# Patient Record
Sex: Female | Born: 1950
Health system: Southern US, Community
[De-identification: ages and names within clinical notes are randomized; demographics above are authoritative.]

## PROBLEM LIST (undated history)

## (undated) ENCOUNTER — Emergency Department (HOSPITAL_COMMUNITY): Discharge status: Absent without leave | Payer: Medicare HMO

## (undated) DIAGNOSIS — E785 Hyperlipidemia, unspecified: Secondary | ICD-10-CM

## (undated) DIAGNOSIS — M810 Age-related osteoporosis without current pathological fracture: Secondary | ICD-10-CM

## (undated) DIAGNOSIS — R112 Nausea with vomiting, unspecified: Secondary | ICD-10-CM

## (undated) DIAGNOSIS — M1990 Unspecified osteoarthritis, unspecified site: Secondary | ICD-10-CM

## (undated) DIAGNOSIS — I10 Essential (primary) hypertension: Secondary | ICD-10-CM

## (undated) DIAGNOSIS — E119 Type 2 diabetes mellitus without complications: Secondary | ICD-10-CM

## (undated) HISTORY — DX: Essential (primary) hypertension: I10

## (undated) HISTORY — DX: Type 2 diabetes mellitus without complications: E11.9

## (undated) HISTORY — DX: Nausea with vomiting, unspecified: R11.2

## (undated) HISTORY — DX: Age-related osteoporosis without current pathological fracture: M81.0

## (undated) HISTORY — DX: Hyperlipidemia, unspecified: E78.5

## (undated) HISTORY — DX: Unspecified osteoarthritis, unspecified site: M19.90

---

## 1997-02-13 HISTORY — PX: FOOT SURGERY: SHX648

## 2005-04-07 ENCOUNTER — Ambulatory Visit: Payer: Self-pay | Admitting: Unknown Physician Specialty

## 2009-05-20 ENCOUNTER — Ambulatory Visit: Payer: Self-pay | Admitting: Internal Medicine

## 2011-03-16 ENCOUNTER — Ambulatory Visit: Payer: Self-pay | Admitting: Internal Medicine

## 2012-09-12 DIAGNOSIS — M5116 Intervertebral disc disorders with radiculopathy, lumbar region: Secondary | ICD-10-CM | POA: Insufficient documentation

## 2012-10-03 ENCOUNTER — Ambulatory Visit: Payer: Self-pay | Admitting: Family Medicine

## 2012-11-26 ENCOUNTER — Ambulatory Visit: Payer: Self-pay | Admitting: Family Medicine

## 2012-12-30 ENCOUNTER — Ambulatory Visit: Payer: Self-pay | Admitting: Gastroenterology

## 2014-08-20 ENCOUNTER — Encounter: Payer: Self-pay | Admitting: Family Medicine

## 2014-08-20 ENCOUNTER — Ambulatory Visit (INDEPENDENT_AMBULATORY_CARE_PROVIDER_SITE_OTHER): Payer: Commercial Managed Care - HMO | Admitting: Family Medicine

## 2014-08-20 VITALS — BP 122/80 | HR 74 | Wt 142.0 lb

## 2014-08-20 DIAGNOSIS — I10 Essential (primary) hypertension: Secondary | ICD-10-CM

## 2014-08-20 DIAGNOSIS — E119 Type 2 diabetes mellitus without complications: Secondary | ICD-10-CM

## 2014-08-20 DIAGNOSIS — E559 Vitamin D deficiency, unspecified: Secondary | ICD-10-CM | POA: Diagnosis not present

## 2014-08-20 MED ORDER — METFORMIN HCL 1000 MG PO TABS: 1000.0000 mg | ORAL_TABLET | Freq: Two times a day (BID) | ORAL | Status: DC

## 2014-08-20 MED ORDER — GLIPIZIDE 5 MG PO TABS: 5.0000 mg | ORAL_TABLET | Freq: Every day | ORAL | Status: DC

## 2014-08-20 MED ORDER — LOSARTAN POTASSIUM 100 MG PO TABS: 100.0000 mg | ORAL_TABLET | Freq: Every day | ORAL | Status: DC

## 2014-08-20 MED ORDER — HYDROCHLOROTHIAZIDE 12.5 MG PO TABS: 12.5000 mg | ORAL_TABLET | Freq: Every day | ORAL | Status: DC

## 2014-08-20 NOTE — Progress Notes (Signed)
Name: Brooke Randall   MRN: [id number]    DOB: March 20, 1950   Date:08/20/2014       Progress Note  Subjective  Chief Complaint  Chief Complaint  Patient presents with  . Diabetes    f/u     Diabetes She presents for her follow-up diabetic visit. She has type 2 diabetes mellitus. There are no hypoglycemic associated symptoms. Pertinent negatives for diabetes include no fatigue, no polydipsia, no polyuria and no weakness. Current diabetic treatment includes oral agent (dual therapy). She is following a diabetic and generally healthy diet. She participates in exercise three times a week. Her breakfast blood glucose is taken between 7-8 am. Her breakfast blood glucose range is generally 110-130 mg/dl. An ACE inhibitor/angiotensin II receptor blocker is being taken.      Past Medical History  Diagnosis Date  . Diabetes mellitus without complication   . Hypertension   . Arthritis   . Osteoporosis     Past Surgical History  Procedure Laterality Date  . Foot surgery Right 1999    Family History  Problem Relation Age of Onset  . Hypertension Mother   . Hypertension Father   . Diabetes Father   . Hypertension Sister   . Diabetes Sister   . Hypertension Brother     History   Social History  . Marital Status: Divorced    Spouse Name: N/A  . Number of Children: N/A  . Years of Education: N/A   Occupational History  . Unemployed     Looking for work   Social History Main Topics  . Smoking status: Never Smoker   . Smokeless tobacco: Never Used  . Alcohol Use: No  . Drug Use: No  . Sexual Activity: Not on file   Other Topics Concern  . Not on file   Social History Narrative     Current outpatient prescriptions:  .  alendronate (FOSAMAX) 35 MG tablet, Take 35 mg by mouth every 7 (seven) days. Take with a full glass of water on an empty stomach., Disp: , Rfl:  .  atorvastatin (LIPITOR) 40 MG tablet, Take 40 mg by mouth daily., Disp: , Rfl:  .  Calcium Carbonate-Vit D-Min  (CALCIUM/VITAMIN D/MINERALS) 600-200 MG-UNIT TABS, Take 1 tablet by mouth daily., Disp: , Rfl:  .  ergocalciferol (VITAMIN D2) 50000 UNITS capsule, Take 50,000 Units by mouth once a week., Disp: , Rfl:  .  gabapentin (NEURONTIN) 300 MG capsule, Take 300 mg by mouth 3 (three) times daily., Disp: , Rfl:  .  glipiZIDE (GLUCOTROL) 5 MG tablet, Take 5 mg by mouth daily before breakfast., Disp: , Rfl:  .  hydrochlorothiazide (HYDRODIURIL) 12.5 MG tablet, Take 12.5 mg by mouth daily., Disp: , Rfl:  .  losartan (COZAAR) 100 MG tablet, Take 100 mg by mouth daily., Disp: , Rfl:  .  meloxicam (MOBIC) 15 MG tablet, Take 15 mg by mouth daily., Disp: , Rfl:  .  metFORMIN (GLUCOPHAGE) 1000 MG tablet, Take 1,000 mg by mouth 2 (two) times daily with a meal., Disp: , Rfl:   No Known Allergies   Review of Systems  Constitutional: Negative for fatigue.  Genitourinary: Negative for frequency.  Neurological: Negative for weakness.  Endo/Heme/Allergies: Negative for polydipsia.      Objective  Filed Vitals:   08/20/14 1416  BP: 122/80  Pulse: 74  Weight: 142 lb (64.411 kg)  SpO2: 96%    Physical Exam  Constitutional: She is well-developed, well-nourished, and in no distress.  HENT:  Head: Normocephalic and atraumatic.  Cardiovascular: Normal rate and regular rhythm.   Pulmonary/Chest: Effort normal and breath sounds normal.  Nursing note and vitals reviewed.   Assessment & Plan  1. Diabetes mellitus without complication  - HgB K7Q - metFORMIN (GLUCOPHAGE) 1000 MG tablet; Take 1 tablet (1,000 mg total) by mouth 2 (two) times daily with a meal.  Dispense: 180 tablet; Refill: 3 - glipiZIDE (GLUCOTROL) 5 MG tablet; Take 1 tablet (5 mg total) by mouth daily before breakfast.  Dispense: 90 tablet; Refill: 3  2. Essential hypertension  - losartan (COZAAR) 100 MG tablet; Take 1 tablet (100 mg total) by mouth daily.  Dispense: 90 tablet; Refill: 3 - hydrochlorothiazide (HYDRODIURIL) 12.5 MG  tablet; Take 1 tablet (12.5 mg total) by mouth daily.  Dispense: 90 tablet; Refill: 3  3. Vitamin D deficiency  - Vit D  25 hydroxy (rtn osteoporosis monitoring)  4. Hyperlipidemia Patient has stopped taking Lipitor because of muscle aches. Repeat FLP in 3 months. Advised on dietary and lifestyle changes.  Brooke Randall A. [facility] 08/20/2014 2:36 PM

## 2014-08-21 LAB — HEMOGLOBIN A1C
ESTIMATED AVERAGE GLUCOSE: 163 mg/dL
Hgb A1c MFr Bld: 7.3 % — ABNORMAL HIGH (ref 4.8–5.6)

## 2014-08-21 LAB — VITAMIN D 25 HYDROXY (VIT D DEFICIENCY, FRACTURES): Vit D, 25-Hydroxy: 27.5 ng/mL — ABNORMAL LOW (ref 30.0–100.0)

## 2014-11-20 ENCOUNTER — Ambulatory Visit: Payer: Commercial Managed Care - HMO | Admitting: Family Medicine

## 2014-12-17 ENCOUNTER — Other Ambulatory Visit: Payer: Self-pay

## 2014-12-17 ENCOUNTER — Ambulatory Visit (INDEPENDENT_AMBULATORY_CARE_PROVIDER_SITE_OTHER): Payer: Commercial Managed Care - HMO | Admitting: Family Medicine

## 2014-12-17 ENCOUNTER — Encounter: Payer: Self-pay | Admitting: Family Medicine

## 2014-12-17 VITALS — BP 120/76 | HR 70 | Temp 98.5°F | Resp 18 | Ht 62.0 in | Wt 136.5 lb

## 2014-12-17 DIAGNOSIS — E785 Hyperlipidemia, unspecified: Secondary | ICD-10-CM | POA: Diagnosis not present

## 2014-12-17 DIAGNOSIS — E119 Type 2 diabetes mellitus without complications: Secondary | ICD-10-CM

## 2014-12-17 DIAGNOSIS — E1142 Type 2 diabetes mellitus with diabetic polyneuropathy: Secondary | ICD-10-CM | POA: Insufficient documentation

## 2014-12-17 DIAGNOSIS — I10 Essential (primary) hypertension: Secondary | ICD-10-CM | POA: Diagnosis not present

## 2014-12-17 LAB — GLUCOSE, POCT (MANUAL RESULT ENTRY): POC Glucose: 179 mg/dl — AB (ref 70–99)

## 2014-12-17 LAB — POCT GLYCOSYLATED HEMOGLOBIN (HGB A1C): Hemoglobin A1C: 7.1

## 2014-12-17 MED ORDER — ALENDRONATE SODIUM 35 MG PO TABS: 35.0000 mg | ORAL_TABLET | ORAL | Status: DC

## 2014-12-17 NOTE — Progress Notes (Signed)
Name: Brooke Randall   MRN: [id number]    DOB: Mar 18, 1950   Date:12/17/2014       Progress Note  Subjective  Chief Complaint  Chief Complaint  Patient presents with  . Follow-up    3 mo  . Labs Only    Fasting  . Diabetes  . Hypertension   Diabetes She presents for her follow-up diabetic visit. She has type 2 diabetes mellitus. Her disease course has been stable. There are no hypoglycemic associated symptoms. Pertinent negatives for hypoglycemia include no headaches. Pertinent negatives for diabetes include no blurred vision and no chest pain. Pertinent negatives for diabetic complications include no CVA. Current diabetic treatment includes oral agent (dual therapy). She is following a generally healthy diet. She has not had a previous visit with a dietitian. She participates in exercise every other day. Her breakfast blood glucose range is generally 110-130 mg/dl. An ACE inhibitor/angiotensin II receptor blocker is being taken.  Hypertension This is a chronic problem. The problem is unchanged. The problem is controlled. Pertinent negatives include no anxiety, blurred vision, chest pain, headaches, palpitations or shortness of breath. Past treatments include angiotensin blockers and diuretics. There is no history of kidney disease, CAD/MI or CVA.  Hyperlipidemia This is a chronic problem. The problem is uncontrolled. Pertinent negatives include no chest pain or shortness of breath. She is currently on no antihyperlipidemic treatment (Was on Atorvastatin, which was discontinued due to side effects.). Compliance problems include medication side effects.     Past Medical History  Diagnosis Date  . Diabetes mellitus without complication ([place])   . Hypertension   . Arthritis   . Osteoporosis     Past Surgical History  Procedure Laterality Date  . Foot surgery Right 1999    Family History  Problem Relation Age of Onset  . Hypertension Mother   . Hypertension Father   . Diabetes Father    . Hypertension Sister   . Diabetes Sister   . Hypertension Brother     Social History   Social History  . Marital Status: Divorced    Spouse Name: N/A  . Number of Children: N/A  . Years of Education: N/A   Occupational History  . Unemployed     Looking for work   Social History Main Topics  . Smoking status: Never Smoker   . Smokeless tobacco: Never Used  . Alcohol Use: No  . Drug Use: No  . Sexual Activity: Not on file   Other Topics Concern  . Not on file   Social History Narrative     Current outpatient prescriptions:  .  alendronate (FOSAMAX) 35 MG tablet, Take 35 mg by mouth every 7 (seven) days. Take with a full glass of water on an empty stomach., Disp: , Rfl:  .  B Complex Vitamins (VITAMIN B COMPLEX) TABS, , Disp: , Rfl:  .  Calcium Carbonate-Vit D-Min (CALCIUM/VITAMIN D/MINERALS) 600-200 MG-UNIT TABS, Take 1 tablet by mouth daily., Disp: , Rfl:  .  glipiZIDE (GLUCOTROL) 5 MG tablet, Take 1 tablet (5 mg total) by mouth daily before breakfast., Disp: 90 tablet, Rfl: 3 .  hydrochlorothiazide (HYDRODIURIL) 12.5 MG tablet, Take 1 tablet (12.5 mg total) by mouth daily., Disp: 90 tablet, Rfl: 3 .  losartan (COZAAR) 100 MG tablet, Take 1 tablet (100 mg total) by mouth daily., Disp: 90 tablet, Rfl: 3 .  metFORMIN (GLUCOPHAGE) 1000 MG tablet, Take 1 tablet (1,000 mg total) by mouth 2 (two) times daily with a meal., Disp: 180  tablet, Rfl: 3  No Known Allergies   Review of Systems  Eyes: Negative for blurred vision.  Respiratory: Negative for shortness of breath.   Cardiovascular: Negative for chest pain and palpitations.  Gastrointestinal: Negative for abdominal pain.  Genitourinary: Negative for dysuria.  Neurological: Negative for headaches.    Objective  Filed Vitals:   12/17/14 0823  BP: 120/76  Pulse: 70  Temp: 98.5 F (36.9 C)  TempSrc: Oral  Resp: 18  Height: 5\' 2"  (1.575 m)  Weight: 136 lb 8 oz (61.916 kg)  SpO2: 95%    Physical Exam   Constitutional: She is oriented to person, place, and time and well-developed, well-nourished, and in no distress.  HENT:  Head: Normocephalic and atraumatic.  Cardiovascular: Normal rate, regular rhythm and normal heart sounds.   No murmur heard. Pulmonary/Chest: Effort normal and breath sounds normal. She has no wheezes. She has no rales.  Abdominal: Soft. Bowel sounds are normal. There is no tenderness.  Musculoskeletal: She exhibits no edema.  Neurological: She is alert and oriented to person, place, and time.  Skin: Skin is warm and dry.  Nursing note and vitals reviewed.   Assessment & Plan  1. Type 2 diabetes mellitus without complication, without long-term current use of insulin (HCC) A1c of 7.1%, considered optimal control. Continue on present therapy. - POCT HgB A1C - POCT Glucose (CBG)  2. Essential hypertension BP at goal on present therapy.  3. Dyslipidemia In the past, patient has been on a atorvastatin, which had to be discontinued because of side effects. Recheck lipids and consider starting on a different statin. - Lipid Profile - Comprehensive Metabolic Panel (CMET)     Akhilesh Sassone Asad A. [facility] 12/17/2014 8:38 AM

## 2014-12-17 NOTE — Telephone Encounter (Signed)
Medication has been refilled and sent to [facility]  

## 2014-12-18 LAB — COMPREHENSIVE METABOLIC PANEL
A/G RATIO: 1.6 (ref 1.1–2.5)
ALT: 14 IU/L (ref 0–32)
AST: 14 IU/L (ref 0–40)
Albumin: 4.1 g/dL (ref 3.6–4.8)
Alkaline Phosphatase: 58 IU/L (ref 39–117)
BUN / CREAT RATIO: 15 (ref 11–26)
BUN: 12 mg/dL (ref 8–27)
Bilirubin Total: 0.7 mg/dL (ref 0.0–1.2)
CALCIUM: 9.6 mg/dL (ref 8.7–10.3)
CHLORIDE: 99 mmol/L (ref 97–106)
CO2: 21 mmol/L (ref 18–29)
Creatinine, Ser: 0.79 mg/dL (ref 0.57–1.00)
GFR calc Af Amer: 92 mL/min/{1.73_m2} (ref >59)
GFR, EST NON AFRICAN AMERICAN: 80 mL/min/{1.73_m2} (ref >59)
Globulin, Total: 2.5 g/dL (ref 1.5–4.5)
Glucose: 200 mg/dL — ABNORMAL HIGH (ref 65–99)
POTASSIUM: 4.2 mmol/L (ref 3.5–5.2)
Sodium: 137 mmol/L (ref 136–144)
TOTAL PROTEIN: 6.6 g/dL (ref 6.0–8.5)

## 2014-12-18 LAB — LIPID PANEL
CHOL/HDL RATIO: 4.9 ratio — AB (ref 0.0–4.4)
Cholesterol, Total: 196 mg/dL (ref 100–199)
HDL: 40 mg/dL (ref >39)
LDL Calculated: 122 mg/dL — ABNORMAL HIGH (ref 0–99)
Triglycerides: 172 mg/dL — ABNORMAL HIGH (ref 0–149)
VLDL Cholesterol Cal: 34 mg/dL (ref 5–40)

## 2014-12-23 ENCOUNTER — Telehealth: Payer: Self-pay | Admitting: Emergency Medicine

## 2014-12-23 MED ORDER — ROSUVASTATIN CALCIUM 5 MG PO TABS: 5.0000 mg | ORAL_TABLET | Freq: Every day | ORAL | Status: DC

## 2014-12-23 NOTE — Telephone Encounter (Signed)
Spoke to patient and notified of lab work. Script called into [facility] [address] for #30. If patient can tolerate she will come back for additional; refills.

## 2015-04-12 ENCOUNTER — Ambulatory Visit: Payer: Commercial Managed Care - HMO | Admitting: Family Medicine

## 2015-05-04 ENCOUNTER — Ambulatory Visit (INDEPENDENT_AMBULATORY_CARE_PROVIDER_SITE_OTHER): Payer: Medicare Other | Admitting: Family Medicine

## 2015-05-04 ENCOUNTER — Encounter: Payer: Self-pay | Admitting: Family Medicine

## 2015-05-04 VITALS — BP 120/73 | HR 81 | Temp 98.6°F | Resp 17 | Ht 62.0 in | Wt 139.2 lb

## 2015-05-04 DIAGNOSIS — Z0000 Encounter for general adult medical examination without abnormal findings: Secondary | ICD-10-CM

## 2015-05-04 DIAGNOSIS — K1370 Unspecified lesions of oral mucosa: Secondary | ICD-10-CM | POA: Insufficient documentation

## 2015-05-04 NOTE — Progress Notes (Signed)
Name: Brooke Randall   MRN: [id number]    DOB: Mar 31, 1950   Date:05/04/2015       Progress Note  Subjective  Chief Complaint  Chief Complaint  Patient presents with  . Annual Exam    CPE    HPI  Pt. Is here for an Annual Wellness visit. She is doing well.  Functional Ability and Safety Issues: No issues, no dependent on anyone for Activities of Daily Living. Lives by herself. No recent falls. No home safety issues. No difficulty with hearing/vision.  End-of-Life Planning: Does not have any advance directives. Would like to discuss them. No HCPOA.  Preventative Care:  As Follows, Colonoscopy: In 2014, was asked to return in 10 years. Pap Smear: In 2016 by [facility].  Mammogram: in 2016, by [facility] Dexa Scan: Last DEXA Scan in October 2014, was Osteopenic at left forearm.  Immunizations:  Flu Vaccine: November 2016 Tetanus: does not remember her last tetanus shot. Pneumovax: March 2013.  Zostavax: March 2013.  Depression and Fall Screening completed.  Discussed and is able to perform ADLs and IADLs without assistance.  Current Medical providers:  [facility] PCP: Dr. Rochel Brome Dr. Earnestine Leys: Orthopedics (last seen in 2016) Dr. Elvina Mattes: Podiatry (last seen in 2016).  Tongue Lesion: Onset 1 year ago, present intermittently. Resolves after 3-4 days but then comes back a while later. Always located in the same area. No changes in oral cavity otherwise.    Past Medical History  Diagnosis Date  . Diabetes mellitus without complication ([place])   . Hypertension   . Arthritis   . Osteoporosis     Past Surgical History  Procedure Laterality Date  . Foot surgery Right 1999    Family History  Problem Relation Age of Onset  . Hypertension Mother   . Hypertension Father   . Diabetes Father   . Hypertension Sister   . Diabetes Sister   . Hypertension Brother     Social History   Social History  . Marital Status: Divorced    Spouse Name: N/A   . Number of Children: N/A  . Years of Education: N/A   Occupational History  . Unemployed     Looking for work   Social History Main Topics  . Smoking status: Never Smoker   . Smokeless tobacco: Never Used  . Alcohol Use: No  . Drug Use: No  . Sexual Activity: Not on file   Other Topics Concern  . Not on file   Social History Narrative     Current outpatient prescriptions:  .  B Complex Vitamins (VITAMIN B COMPLEX) TABS, , Disp: , Rfl:  .  Calcium Carbonate-Vit D-Min (CALCIUM/VITAMIN D/MINERALS) 600-200 MG-UNIT TABS, Take 1 tablet by mouth daily., Disp: , Rfl:  .  glipiZIDE (GLUCOTROL) 5 MG tablet, Take 1 tablet (5 mg total) by mouth daily before breakfast., Disp: 90 tablet, Rfl: 3 .  hydrochlorothiazide (HYDRODIURIL) 12.5 MG tablet, Take 1 tablet (12.5 mg total) by mouth daily., Disp: 90 tablet, Rfl: 3 .  losartan (COZAAR) 100 MG tablet, Take 1 tablet (100 mg total) by mouth daily., Disp: 90 tablet, Rfl: 3 .  metFORMIN (GLUCOPHAGE) 1000 MG tablet, Take 1 tablet (1,000 mg total) by mouth 2 (two) times daily with a meal., Disp: 180 tablet, Rfl: 3  No Known Allergies   Review of Systems  Constitutional: Negative for fever, chills, weight loss and malaise/fatigue.     Objective  Filed Vitals:   05/04/15 1403  BP:  120/73  Pulse: 81  Temp: 98.6 F (37 C)  TempSrc: Oral  Resp: 17  Height: 5\' 2"  (1.575 m)  Weight: 139 lb 3.2 oz (63.141 kg)  SpO2: 96%    Physical Exam  Constitutional: She is well-developed, well-nourished, and in no distress.  HENT:  No lesion identified on patient's tongue.  Cardiovascular: Normal rate and regular rhythm.   Pulmonary/Chest: Effort normal and breath sounds normal.  Nursing note and vitals reviewed.      Assessment & Plan  1. Medicare annual wellness visit, initial Please refer to history of present illness for pertinent discussion and recommendations.  2. Oral mucosal lesion No lesion identified, likely resolved.  Recommended to return when the lesion comes back and we'll consider referral to ENT for biopsy.   Tarica Harl Asad A. [facility] 05/04/2015 2:11 PM

## 2015-05-19 ENCOUNTER — Ambulatory Visit (INDEPENDENT_AMBULATORY_CARE_PROVIDER_SITE_OTHER): Payer: Medicare Other | Admitting: Family Medicine

## 2015-05-19 ENCOUNTER — Encounter: Payer: Self-pay | Admitting: Family Medicine

## 2015-05-19 VITALS — BP 120/63 | HR 71 | Temp 98.0°F | Resp 17 | Ht 62.0 in | Wt 138.3 lb

## 2015-05-19 DIAGNOSIS — E119 Type 2 diabetes mellitus without complications: Secondary | ICD-10-CM

## 2015-05-19 DIAGNOSIS — I10 Essential (primary) hypertension: Secondary | ICD-10-CM

## 2015-05-19 DIAGNOSIS — E785 Hyperlipidemia, unspecified: Secondary | ICD-10-CM | POA: Diagnosis not present

## 2015-05-19 DIAGNOSIS — E559 Vitamin D deficiency, unspecified: Secondary | ICD-10-CM | POA: Diagnosis not present

## 2015-05-19 LAB — POCT UA - MICROALBUMIN
ALBUMIN/CREATININE RATIO, URINE, POC: NEGATIVE
Creatinine, POC: NEGATIVE mg/dL
Microalbumin Ur, POC: NEGATIVE mg/L

## 2015-05-19 LAB — GLUCOSE, POCT (MANUAL RESULT ENTRY): POC GLUCOSE: 160 mg/dL — AB (ref 70–99)

## 2015-05-19 LAB — POCT GLYCOSYLATED HEMOGLOBIN (HGB A1C): Hemoglobin A1C: 7.4

## 2015-05-19 MED ORDER — SITAGLIPTIN PHOSPHATE 50 MG PO TABS: 50.0000 mg | ORAL_TABLET | Freq: Every day | ORAL | Status: DC

## 2015-05-19 NOTE — Progress Notes (Signed)
Name: Brooke Randall   MRN: [id number]    DOB: 11/10/1950   Date:05/19/2015       Progress Note  Subjective  Chief Complaint  Chief Complaint  Patient presents with  . Follow-up    2 wk    HPI  Diabetes Mellitus: Pt. Presents for follow up of Diabetes Mellitus Type 2. A1c increased to 7.4% from 7.1% in the last 4-5 months. She has stopped walking during the winter, is going to restart walking.  Hypertension; Pt. Presents for follow up of Hypertension. She is on HCTZ 12.5 mg daily and Losartan 100 mg daily. BP is well-controlled, no chest pain, headache, blurry vision.  Hyperlipidemia: Pt. Is here for follow up of Hyperlipidemia, Lipitor was stopped at last visit due to muscle aches especially legs. She tries to eat a balanced diet, avoids fried foods.     Past Medical History  Diagnosis Date  . Diabetes mellitus without complication ([place])   . Hypertension   . Arthritis   . Osteoporosis     Past Surgical History  Procedure Laterality Date  . Foot surgery Right 1999    Family History  Problem Relation Age of Onset  . Hypertension Mother   . Hypertension Father   . Diabetes Father   . Hypertension Sister   . Diabetes Sister   . Hypertension Brother     Social History   Social History  . Marital Status: Divorced    Spouse Name: N/A  . Number of Children: N/A  . Years of Education: N/A   Occupational History  . Unemployed     Looking for work   Social History Main Topics  . Smoking status: Never Smoker   . Smokeless tobacco: Never Used  . Alcohol Use: No  . Drug Use: No  . Sexual Activity: Not on file   Other Topics Concern  . Not on file   Social History Narrative     Current outpatient prescriptions:  .  B Complex Vitamins (VITAMIN B COMPLEX) TABS, , Disp: , Rfl:  .  Calcium Carbonate-Vit D-Min (CALCIUM/VITAMIN D/MINERALS) 600-200 MG-UNIT TABS, Take 1 tablet by mouth daily., Disp: , Rfl:  .  glipiZIDE (GLUCOTROL) 5 MG tablet, Take 1 tablet (5 mg  total) by mouth daily before breakfast., Disp: 90 tablet, Rfl: 3 .  hydrochlorothiazide (HYDRODIURIL) 12.5 MG tablet, Take 1 tablet (12.5 mg total) by mouth daily., Disp: 90 tablet, Rfl: 3 .  losartan (COZAAR) 100 MG tablet, Take 1 tablet (100 mg total) by mouth daily., Disp: 90 tablet, Rfl: 3 .  metFORMIN (GLUCOPHAGE) 1000 MG tablet, Take 1 tablet (1,000 mg total) by mouth 2 (two) times daily with a meal., Disp: 180 tablet, Rfl: 3  No Known Allergies   Review of Systems  Constitutional: Negative for fever, chills, weight loss and malaise/fatigue.  Respiratory: Negative for cough and shortness of breath.   Cardiovascular: Negative for chest pain.  Gastrointestinal: Negative for abdominal pain.    Objective  Filed Vitals:   05/19/15 0930  BP: 120/63  Pulse: 71  Temp: 98 F (36.7 C)  TempSrc: Oral  Resp: 17  Height: 5\' 2"  (1.575 m)  Weight: 138 lb 4.8 oz (62.732 kg)  SpO2: 97%    Physical Exam  Constitutional: She is oriented to person, place, and time and well-developed, well-nourished, and in no distress.  HENT:  Head: Normocephalic and atraumatic.  Cardiovascular: Normal rate and regular rhythm.   Pulmonary/Chest: Effort normal and breath sounds normal.  Abdominal: Soft. Bowel  sounds are normal.  Musculoskeletal:       Right ankle: She exhibits no swelling.       Left ankle: She exhibits no swelling.  Neurological: She is alert and oriented to person, place, and time.  Nursing note and vitals reviewed.    Assessment & Plan  1. Essential hypertension Blood pressure at goal, continue present therapy.  2. Dyslipidemia Atorvastatin because of side effects. Repeat FLP and consider starting on Crestor - Lipid Profile  3. Type 2 diabetes mellitus without complication, without long-term current use of insulin (HCC) A1c elevated above goal, we will DC glipizide and start patient on Januvia 50 mg daily. Continue on metformin at present dosage. Recheck in 3-4 months. -  POCT UA - Microalbumin - POCT HgB A1C - POCT Glucose (CBG) - sitaGLIPtin (JANUVIA) 50 MG tablet; Take 1 tablet (50 mg total) by mouth daily.  Dispense: 90 tablet; Refill: 1  4. Vitamin D deficiency  - Vitamin D (25 hydroxy)   Omare Bilotta Asad A. [facility] 05/19/2015 9:40 AM

## 2015-05-20 LAB — LIPID PANEL
CHOL/HDL RATIO: 4.5 ratio — AB (ref 0.0–4.4)
Cholesterol, Total: 194 mg/dL (ref 100–199)
HDL: 43 mg/dL (ref >39)
LDL Calculated: 117 mg/dL — ABNORMAL HIGH (ref 0–99)
Triglycerides: 169 mg/dL — ABNORMAL HIGH (ref 0–149)
VLDL CHOLESTEROL CAL: 34 mg/dL (ref 5–40)

## 2015-05-20 LAB — VITAMIN D 25 HYDROXY (VIT D DEFICIENCY, FRACTURES): VIT D 25 HYDROXY: 30.3 ng/mL (ref 30.0–100.0)

## 2015-05-25 ENCOUNTER — Encounter: Payer: Self-pay | Admitting: Family Medicine

## 2015-08-11 ENCOUNTER — Encounter: Payer: Self-pay | Admitting: Family Medicine

## 2015-08-13 ENCOUNTER — Other Ambulatory Visit: Payer: Self-pay | Admitting: Family Medicine

## 2015-08-13 DIAGNOSIS — I10 Essential (primary) hypertension: Secondary | ICD-10-CM

## 2015-08-13 DIAGNOSIS — E119 Type 2 diabetes mellitus without complications: Secondary | ICD-10-CM

## 2015-08-13 MED ORDER — METFORMIN HCL 1000 MG PO TABS: 1000.0000 mg | ORAL_TABLET | Freq: Two times a day (BID) | ORAL | Status: DC

## 2015-08-13 MED ORDER — GLIPIZIDE 5 MG PO TABS: 5.0000 mg | ORAL_TABLET | Freq: Every day | ORAL | Status: DC

## 2015-08-13 MED ORDER — LOSARTAN POTASSIUM 100 MG PO TABS: 100.0000 mg | ORAL_TABLET | Freq: Every day | ORAL | Status: DC

## 2015-08-13 MED ORDER — HYDROCHLOROTHIAZIDE 12.5 MG PO TABS
12.5000 mg | ORAL_TABLET | Freq: Every day | ORAL | Status: DC
Start: 2015-08-13 — End: 2016-04-21

## 2015-08-23 ENCOUNTER — Encounter: Payer: Self-pay | Admitting: Family Medicine

## 2015-08-24 ENCOUNTER — Ambulatory Visit (INDEPENDENT_AMBULATORY_CARE_PROVIDER_SITE_OTHER): Payer: Medicare Other | Admitting: Family Medicine

## 2015-08-24 ENCOUNTER — Encounter: Payer: Self-pay | Admitting: Family Medicine

## 2015-08-24 ENCOUNTER — Ambulatory Visit: Payer: Medicare Other | Admitting: Family Medicine

## 2015-08-24 DIAGNOSIS — J0140 Acute pansinusitis, unspecified: Secondary | ICD-10-CM

## 2015-08-24 DIAGNOSIS — E119 Type 2 diabetes mellitus without complications: Secondary | ICD-10-CM | POA: Diagnosis not present

## 2015-08-24 DIAGNOSIS — J019 Acute sinusitis, unspecified: Secondary | ICD-10-CM | POA: Insufficient documentation

## 2015-08-24 MED ORDER — FLUTICASONE PROPIONATE 50 MCG/ACT NA SUSP: 2.0000 | Freq: Every day | NASAL | Status: DC

## 2015-08-24 MED ORDER — AZITHROMYCIN 250 MG PO TABS: ORAL_TABLET | ORAL | Status: DC

## 2015-08-24 MED ORDER — GLIPIZIDE 5 MG PO TABS: 7.5000 mg | ORAL_TABLET | Freq: Every day | ORAL | Status: DC

## 2015-08-24 NOTE — Progress Notes (Signed)
Name: Brooke Randall   MRN: [id number]    DOB: 01-Sep-1950   Date:08/24/2015       Progress Note  Subjective  Chief Complaint  Chief Complaint  Patient presents with  . Tinnitus    HPI  Diabetes Mellitus: Diabetes Mellitus is worse, A1c 8.2%, patient states may not have been taking her medication on some days over the past 3 months. She is on Metfromin 1000mg  twice daily and Glipizide 5mg  daily.   Hearing Impairment: Pt. Started experiencing discomfort in the left ear last week, initially started as pressure in both ears (left is worse than right), no change in severity since then. Feels like she has landed after an Biomedical engineer and her ears need to 'pop.' She cannot hear anything out of her left ear and overall feels her voice 'echoing' back to her. No sore throat or cough reported.  Past Medical History  Diagnosis Date  . Diabetes mellitus without complication ([place])   . Hypertension   . Arthritis   . Osteoporosis     Past Surgical History  Procedure Laterality Date  . Foot surgery Right 1999    Family History  Problem Relation Age of Onset  . Hypertension Mother   . Hypertension Father   . Diabetes Father   . Hypertension Sister   . Diabetes Sister   . Hypertension Brother     Social History   Social History  . Marital Status: Divorced    Spouse Name: N/A  . Number of Children: N/A  . Years of Education: N/A   Occupational History  . Unemployed     Looking for work   Social History Main Topics  . Smoking status: Never Smoker   . Smokeless tobacco: Never Used  . Alcohol Use: No  . Drug Use: No  . Sexual Activity: Not on file   Other Topics Concern  . Not on file   Social History Narrative     Current outpatient prescriptions:  .  B Complex Vitamins (VITAMIN B COMPLEX) TABS, , Disp: , Rfl:  .  Calcium Carbonate-Vit D-Min (CALCIUM/VITAMIN D/MINERALS) 600-200 MG-UNIT TABS, Take 1 tablet by mouth daily., Disp: , Rfl:  .  glipiZIDE (GLUCOTROL) 5 MG  tablet, Take 1 tablet (5 mg total) by mouth daily before breakfast., Disp: 90 tablet, Rfl: 3 .  hydrochlorothiazide (HYDRODIURIL) 12.5 MG tablet, Take 1 tablet (12.5 mg total) by mouth daily., Disp: 90 tablet, Rfl: 3 .  losartan (COZAAR) 100 MG tablet, Take 1 tablet (100 mg total) by mouth daily., Disp: 90 tablet, Rfl: 3 .  metFORMIN (GLUCOPHAGE) 1000 MG tablet, Take 1 tablet (1,000 mg total) by mouth 2 (two) times daily with a meal., Disp: 180 tablet, Rfl: 3  No Known Allergies   Review of Systems  HENT: Positive for congestion. Negative for sore throat.   Cardiovascular: Negative for chest pain.  Neurological: Positive for headaches.   Objective  Filed Vitals:   08/24/15 1510  BP: 128/81  Pulse: 93  Temp: 98.3 F (36.8 C)  TempSrc: Oral  Resp: 17  Height: 5\' 2"  (1.575 m)  Weight: 143 lb 3.2 oz (64.955 kg)  SpO2: 96%    Physical Exam  Constitutional: She is well-developed, well-nourished, and in no distress.  HENT:  Right Ear: Tympanic membrane and ear canal normal. No drainage.  Left Ear: Tympanic membrane and ear canal normal. No drainage.  Nose: Right sinus exhibits no maxillary sinus tenderness and no frontal sinus tenderness. Left sinus exhibits no maxillary sinus  tenderness and no frontal sinus tenderness.  Mouth/Throat: Posterior oropharyngeal erythema present. No oropharyngeal exudate.  Nasal turbinates inflamed and hypertrophied  Cardiovascular: S1 normal, S2 normal and normal heart sounds.   No murmur heard. Pulmonary/Chest: Breath sounds normal. She has no wheezes. She has no rhonchi.  Nursing note and vitals reviewed.      Assessment & Plan  1. Acute pansinusitis, recurrence not specified Ear canal and tympanic membrane exam is normal, symptoms of pressure in the ear are most likely from sinusitis. Start on antibiotic and Flonase. - fluticasone (FLONASE) 50 MCG/ACT nasal spray; Place 2 sprays into both nostrils daily.  Dispense: 16 g; Refill: 0 -  azithromycin (ZITHROMAX) 250 MG tablet; 2 tabs po day 1, then 1 tab po q day x 4 days  Dispense: 6 tablet; Refill: 0  2. Type 2 diabetes mellitus without complication, without long-term current use of insulin (HCC) Increase glipizide to 1.5 tablets (total of 7.5 mg) daily.  - glipiZIDE (GLUCOTROL) 5 MG tablet; Take 1.5 tablets (7.5 mg total) by mouth daily with supper.  Dispense: 135 tablet; Refill: 1 - POCT HgB A1C - POCT Glucose (CBG)   Brooke Randall [facility] 08/24/2015 3:31 PM

## 2015-08-25 LAB — POCT GLYCOSYLATED HEMOGLOBIN (HGB A1C): HEMOGLOBIN A1C: 8.2

## 2015-08-25 LAB — GLUCOSE, POCT (MANUAL RESULT ENTRY): POC GLUCOSE: 245 mg/dL — AB (ref 70–99)

## 2015-11-18 ENCOUNTER — Telehealth: Payer: Self-pay | Admitting: Family Medicine

## 2015-11-18 DIAGNOSIS — E119 Type 2 diabetes mellitus without complications: Secondary | ICD-10-CM

## 2015-11-18 NOTE — Telephone Encounter (Signed)
Patient states that the mail order pharmacy will not be able to send out her metformin 1000mg  in time. She is completely out and is asking that you send a 90day supply to [facility].

## 2015-11-19 MED ORDER — METFORMIN HCL 1000 MG PO TABS: 1000.0000 mg | ORAL_TABLET | Freq: Two times a day (BID) | ORAL | 0 refills | Status: DC

## 2015-11-19 NOTE — Telephone Encounter (Signed)
Medication has been refilled and sent to [facility] [address] 

## 2016-01-11 ENCOUNTER — Encounter: Payer: Self-pay | Admitting: Family Medicine

## 2016-01-11 ENCOUNTER — Ambulatory Visit (INDEPENDENT_AMBULATORY_CARE_PROVIDER_SITE_OTHER): Payer: Medicare Other | Admitting: Family Medicine

## 2016-01-11 VITALS — BP 120/78 | HR 85 | Temp 98.2°F | Resp 16 | Ht 62.0 in | Wt 145.5 lb

## 2016-01-11 DIAGNOSIS — E1165 Type 2 diabetes mellitus with hyperglycemia: Secondary | ICD-10-CM | POA: Diagnosis not present

## 2016-01-11 DIAGNOSIS — E1142 Type 2 diabetes mellitus with diabetic polyneuropathy: Secondary | ICD-10-CM | POA: Diagnosis not present

## 2016-01-11 DIAGNOSIS — E785 Hyperlipidemia, unspecified: Secondary | ICD-10-CM

## 2016-01-11 DIAGNOSIS — M5441 Lumbago with sciatica, right side: Secondary | ICD-10-CM | POA: Diagnosis not present

## 2016-01-11 DIAGNOSIS — I10 Essential (primary) hypertension: Secondary | ICD-10-CM

## 2016-01-11 DIAGNOSIS — IMO0002 Reserved for concepts with insufficient information to code with codable children: Secondary | ICD-10-CM

## 2016-01-11 LAB — GLUCOSE, POCT (MANUAL RESULT ENTRY): POC GLUCOSE: 227 mg/dL — AB (ref 70–99)

## 2016-01-11 LAB — POCT GLYCOSYLATED HEMOGLOBIN (HGB A1C): HEMOGLOBIN A1C: 8.5

## 2016-01-11 MED ORDER — PREDNISONE 10 MG (21) PO TBPK: 10.0000 mg | ORAL_TABLET | Freq: Every day | ORAL | 0 refills | Status: DC

## 2016-01-11 MED ORDER — METFORMIN HCL 1000 MG PO TABS: 1000.0000 mg | ORAL_TABLET | Freq: Two times a day (BID) | ORAL | 0 refills | Status: DC

## 2016-01-11 MED ORDER — GLIPIZIDE 5 MG PO TABS: 5.0000 mg | ORAL_TABLET | Freq: Two times a day (BID) | ORAL | 1 refills | Status: DC

## 2016-01-11 NOTE — Progress Notes (Signed)
Name: Brooke Randall   MRN: [id number]    DOB: October 18, 1950   Date:01/11/2016       Progress Note  Subjective  Chief Complaint  Chief Complaint  Patient presents with  . Follow-up    3 mo    Diabetes  She presents for her follow-up diabetic visit. She has type 2 diabetes mellitus. Her disease course has been worsening. There are no hypoglycemic associated symptoms. Pertinent negatives for hypoglycemia include no headaches or sweats. Associated symptoms include foot paresthesias (numbness and tingling in both feet mainly at night). Pertinent negatives for diabetes include no blurred vision, no fatigue, no polydipsia and no polyuria. Pertinent negatives for diabetic complications include no CVA. Current diabetic treatment includes oral agent (dual therapy). She is compliant with treatment all of the time. Her weight is stable. She is following a diabetic and generally healthy diet. She rarely participates in exercise. She monitors blood glucose at home 3-4 x per week. Her home blood glucose trend is fluctuating dramatically. Her breakfast blood glucose range is generally 180-200 mg/dl. An ACE inhibitor/angiotensin II receptor blocker is being taken.  Hypertension  This is a chronic problem. The problem is unchanged. The problem is controlled. Pertinent negatives include no blurred vision, headaches, orthopnea, palpitations or sweats. Past treatments include angiotensin blockers. There is no history of kidney disease, CAD/MI or CVA.  Back Pain  This is a new problem. The current episode started in the past 7 days (she had similar low back and hip pain 4 years ago, diagnosed as sciatica, by MRI, received injections to the lower back and after a few months, the pain dissipated.). The problem occurs constantly. The pain is present in the gluteal and lumbar spine. The pain radiates to the right thigh, right knee and right foot. The pain is at a severity of 9/10. The symptoms are aggravated by position (worse  with lying down). Stiffness is present at night. Associated symptoms include numbness. Pertinent negatives include no headaches. She has tried NSAIDs for the symptoms.    Past Medical History:  Diagnosis Date  . Arthritis   . Diabetes mellitus without complication ([place])   . Hypertension   . Osteoporosis     Past Surgical History:  Procedure Laterality Date  . FOOT SURGERY Right 1999    Family History  Problem Relation Age of Onset  . Hypertension Mother   . Hypertension Father   . Diabetes Father   . Hypertension Sister   . Diabetes Sister   . Hypertension Brother     Social History   Social History  . Marital status: Divorced    Spouse name: N/A  . Number of children: N/A  . Years of education: N/A   Occupational History  . Unemployed     Looking for work   Social History Main Topics  . Smoking status: Never Smoker  . Smokeless tobacco: Never Used  . Alcohol use No  . Drug use: No  . Sexual activity: Not on file   Other Topics Concern  . Not on file   Social History Narrative  . No narrative on file     Current Outpatient Prescriptions:  .  Calcium Carbonate-Vit D-Min (CALCIUM/VITAMIN D/MINERALS) 600-200 MG-UNIT TABS, Take 1 tablet by mouth daily., Disp: , Rfl:  .  glipiZIDE (GLUCOTROL) 5 MG tablet, Take 1.5 tablets (7.5 mg total) by mouth daily with supper., Disp: 135 tablet, Rfl: 1 .  hydrochlorothiazide (HYDRODIURIL) 12.5 MG tablet, Take 1 tablet (12.5 mg total) by mouth  daily., Disp: 90 tablet, Rfl: 3 .  losartan (COZAAR) 100 MG tablet, Take 1 tablet (100 mg total) by mouth daily., Disp: 90 tablet, Rfl: 3 .  metFORMIN (GLUCOPHAGE) 1000 MG tablet, Take 1 tablet (1,000 mg total) by mouth 2 (two) times daily with a meal., Disp: 180 tablet, Rfl: 0  No Known Allergies   Review of Systems  Constitutional: Negative for fatigue.  Eyes: Negative for blurred vision.  Cardiovascular: Negative for palpitations and orthopnea.  Musculoskeletal: Positive for  back pain.  Neurological: Positive for numbness. Negative for headaches.  Endo/Heme/Allergies: Negative for polydipsia.     Objective  Vitals:   01/11/16 1409  BP: 120/78  Pulse: 85  Resp: 16  Temp: 98.2 F (36.8 C)  TempSrc: Oral  SpO2: 96%  Weight: 145 lb 8 oz (66 kg)  Height: 5\' 2"  (1.575 m)    Physical Exam  Constitutional: She is oriented to person, place, and time and well-developed, well-nourished, and in no distress.  HENT:  Head: Normocephalic and atraumatic.  Cardiovascular: Normal rate, regular rhythm and normal heart sounds.   No murmur heard. Pulmonary/Chest: Effort normal and breath sounds normal. She has no wheezes.  Abdominal: Soft. Bowel sounds are normal. There is no tenderness.  Musculoskeletal:       Back:  Neurological: She is alert and oriented to person, place, and time.  Nursing note and vitals reviewed.      Recent Results (from the past 2160 hour(s))  POCT HgB A1C     Status: None   Collection Time: 01/11/16  2:53 PM  Result Value Ref Range   Hemoglobin A1C 8.5   POCT Glucose (CBG)     Status: Abnormal   Collection Time: 01/11/16  2:53 PM  Result Value Ref Range   POC Glucose 227 (A) 70 - 99 mg/dl     Assessment & Plan  1. Uncontrolled type 2 diabetes mellitus with peripheral neuropathy (HCC) A1c is 8.5%, worsening diabetes control. We'll increase glipizide to 5 mg twice daily. Continue on metformin - POCT HgB A1C - POCT Glucose (CBG) - glipiZIDE (GLUCOTROL) 5 MG tablet; Take 1 tablet (5 mg total) by mouth 2 (two) times daily before a meal.  Dispense: 180 tablet; Refill: 1 - metFORMIN (GLUCOPHAGE) 1000 MG tablet; Take 1 tablet (1,000 mg total) by mouth 2 (two) times daily with a meal.  Dispense: 180 tablet; Refill: 0  2. Essential hypertension Stable and controlled on present antihypertensive therapy  3. Acute midline low back pain with right-sided sciatica Likely from nerve root irritation as she had similar symptoms 4 years  ago, was treated with cortisone shots to her back. We will start on prednisone tapering dose over 6 days, advised to check blood glucose regularly and if her readings are above 350 mg/dL, she should contact this provider. - predniSONE (STERAPRED UNI-PAK 21 TAB) 10 MG (21) TBPK tablet; Take 1 tablet (10 mg total) by mouth daily. 60 50 40 30 20 10  then STOP  Dispense: 21 tablet; Refill: 0        Marcos Peloso [facility] 01/11/2016 3:03 PM

## 2016-01-18 ENCOUNTER — Telehealth: Payer: Self-pay | Admitting: Family Medicine

## 2016-01-18 NOTE — Telephone Encounter (Signed)
Pt needs glucose meter and all supplies that go with it to be called into [facility].

## 2016-01-21 NOTE — Telephone Encounter (Signed)
Routed to Dr. Manuella Ghazi for hand written prescription to fax to [place] for diabetic meter and supplies

## 2016-01-21 NOTE — Telephone Encounter (Signed)
Prescription is ready to be faxed

## 2016-02-16 DIAGNOSIS — E113213 Type 2 diabetes mellitus with mild nonproliferative diabetic retinopathy with macular edema, bilateral: Secondary | ICD-10-CM | POA: Diagnosis not present

## 2016-02-21 DIAGNOSIS — M79674 Pain in right toe(s): Secondary | ICD-10-CM | POA: Diagnosis not present

## 2016-02-21 DIAGNOSIS — G5761 Lesion of plantar nerve, right lower limb: Secondary | ICD-10-CM | POA: Diagnosis not present

## 2016-02-21 DIAGNOSIS — L600 Ingrowing nail: Secondary | ICD-10-CM | POA: Diagnosis not present

## 2016-02-21 DIAGNOSIS — M79675 Pain in left toe(s): Secondary | ICD-10-CM | POA: Diagnosis not present

## 2016-02-24 ENCOUNTER — Encounter: Payer: Self-pay | Admitting: Family Medicine

## 2016-03-15 ENCOUNTER — Telehealth: Payer: Self-pay | Admitting: Family Medicine

## 2016-03-15 DIAGNOSIS — E1142 Type 2 diabetes mellitus with diabetic polyneuropathy: Secondary | ICD-10-CM

## 2016-03-15 DIAGNOSIS — IMO0002 Reserved for concepts with insufficient information to code with codable children: Secondary | ICD-10-CM

## 2016-03-15 DIAGNOSIS — E1165 Type 2 diabetes mellitus with hyperglycemia: Principal | ICD-10-CM

## 2016-03-15 NOTE — Telephone Encounter (Signed)
Pt has changed insurance company and is requesting a prescription for metformin 1000 mg to be sent to [facility]

## 2016-03-21 MED ORDER — METFORMIN HCL 1000 MG PO TABS: 1000.0000 mg | ORAL_TABLET | Freq: Two times a day (BID) | ORAL | 0 refills | Status: DC

## 2016-03-21 NOTE — Telephone Encounter (Signed)
Medication has been refilled and sent to [facility] [address] 

## 2016-03-31 ENCOUNTER — Ambulatory Visit (INDEPENDENT_AMBULATORY_CARE_PROVIDER_SITE_OTHER): Payer: Medicare HMO | Admitting: Family Medicine

## 2016-03-31 ENCOUNTER — Encounter: Payer: Self-pay | Admitting: Family Medicine

## 2016-03-31 VITALS — BP 122/66 | HR 76 | Temp 97.6°F | Resp 15 | Ht 62.0 in | Wt 138.0 lb

## 2016-03-31 DIAGNOSIS — Z1211 Encounter for screening for malignant neoplasm of colon: Secondary | ICD-10-CM

## 2016-03-31 DIAGNOSIS — E119 Type 2 diabetes mellitus without complications: Secondary | ICD-10-CM | POA: Diagnosis not present

## 2016-03-31 DIAGNOSIS — E559 Vitamin D deficiency, unspecified: Secondary | ICD-10-CM | POA: Diagnosis not present

## 2016-03-31 DIAGNOSIS — Z0000 Encounter for general adult medical examination without abnormal findings: Secondary | ICD-10-CM

## 2016-03-31 DIAGNOSIS — Z780 Asymptomatic menopausal state: Secondary | ICD-10-CM

## 2016-03-31 DIAGNOSIS — E785 Hyperlipidemia, unspecified: Secondary | ICD-10-CM | POA: Diagnosis not present

## 2016-03-31 DIAGNOSIS — I10 Essential (primary) hypertension: Secondary | ICD-10-CM | POA: Diagnosis not present

## 2016-03-31 DIAGNOSIS — Z23 Encounter for immunization: Secondary | ICD-10-CM

## 2016-03-31 LAB — CBC WITH DIFFERENTIAL/PLATELET
BASOS PCT: 1 %
Basophils Absolute: 54 cells/uL (ref 0–200)
EOS ABS: 216 {cells}/uL (ref 15–500)
Eosinophils Relative: 4 %
HCT: 41.4 % (ref 35.0–45.0)
Hemoglobin: 13.3 g/dL (ref 11.7–15.5)
LYMPHS PCT: 34 %
Lymphs Abs: 1836 cells/uL (ref 850–3900)
MCH: 28.2 pg (ref 27.0–33.0)
MCHC: 32.1 g/dL (ref 32.0–36.0)
MCV: 87.7 fL (ref 80.0–100.0)
MONOS PCT: 6 %
MPV: 11 fL (ref 7.5–12.5)
Monocytes Absolute: 324 cells/uL (ref 200–950)
NEUTROS PCT: 55 %
Neutro Abs: 2970 cells/uL (ref 1500–7800)
PLATELETS: 272 10*3/uL (ref 140–400)
RBC: 4.72 MIL/uL (ref 3.80–5.10)
RDW: 13.6 % (ref 11.0–15.0)
WBC: 5.4 10*3/uL (ref 3.8–10.8)

## 2016-03-31 LAB — LIPID PANEL
CHOL/HDL RATIO: 4.9 ratio (ref <5.0)
CHOLESTEROL: 185 mg/dL (ref <200)
HDL: 38 mg/dL — ABNORMAL LOW (ref >50)
LDL CALC: 112 mg/dL — AB (ref <100)
Triglycerides: 176 mg/dL — ABNORMAL HIGH (ref <150)
VLDL: 35 mg/dL — AB (ref <30)

## 2016-03-31 LAB — TSH: TSH: 3.92 mIU/L

## 2016-03-31 NOTE — Progress Notes (Addendum)
Name: Brooke Randall   MRN: [id number]    DOB: 07/24/1950   Date:03/31/2016       Progress Note  Subjective  Chief Complaint  Chief Complaint  Patient presents with  . Annual Exam    CPE    HPI  Pt. Presents for Annual Physical Exam. She is doing well. She has a Editor, commissioning who follows up with GYN screenings including Pap Smear and Mammogram.  Last colonoscopy was performed 4 years ago, was advised to repeat in 10 years.   Past Medical History:  Diagnosis Date  . Arthritis   . Diabetes mellitus without complication ([place])   . Hypertension   . Osteoporosis     Past Surgical History:  Procedure Laterality Date  . FOOT SURGERY Right 1999    Family History  Problem Relation Age of Onset  . Hypertension Mother   . Hypertension Father   . Diabetes Father   . Hypertension Sister   . Diabetes Sister   . Hypertension Brother     Social History   Social History  . Marital status: Divorced    Spouse name: N/A  . Number of children: N/A  . Years of education: N/A   Occupational History  . Unemployed     Looking for work   Social History Main Topics  . Smoking status: Never Smoker  . Smokeless tobacco: Never Used  . Alcohol use No  . Drug use: No  . Sexual activity: Not on file   Other Topics Concern  . Not on file   Social History Narrative  . No narrative on file     Current Outpatient Prescriptions:  .  Calcium Carbonate-Vit D-Min (CALCIUM/VITAMIN D/MINERALS) 600-200 MG-UNIT TABS, Take 1 tablet by mouth daily., Disp: , Rfl:  .  glipiZIDE (GLUCOTROL) 5 MG tablet, Take 1 tablet (5 mg total) by mouth 2 (two) times daily before a meal., Disp: 180 tablet, Rfl: 1 .  hydrochlorothiazide (HYDRODIURIL) 12.5 MG tablet, Take 1 tablet (12.5 mg total) by mouth daily., Disp: 90 tablet, Rfl: 3 .  losartan (COZAAR) 100 MG tablet, Take 1 tablet (100 mg total) by mouth daily., Disp: 90 tablet, Rfl: 3 .  metFORMIN (GLUCOPHAGE) 1000 MG tablet, Take 1 tablet (1,000 mg total)  by mouth 2 (two) times daily with a meal., Disp: 180 tablet, Rfl: 0 .  predniSONE (STERAPRED UNI-PAK 21 TAB) 10 MG (21) TBPK tablet, Take 1 tablet (10 mg total) by mouth daily. 60 50 40 30 20 10  then STOP (Patient not taking: Reported on 03/31/2016), Disp: 21 tablet, Rfl: 0  No Known Allergies   Review of Systems  Constitutional: Negative for chills, fever, malaise/fatigue and weight loss.  HENT: Negative for congestion, sinus pain and sore throat.   Eyes: Negative for blurred vision and double vision.  Respiratory: Negative for cough and shortness of breath.   Cardiovascular: Negative for chest pain and leg swelling.  Gastrointestinal: Negative for blood in stool, constipation (chronic constipation, takes Metamucil), nausea and vomiting.  Genitourinary: Negative for dysuria and hematuria.  Musculoskeletal: Negative for back pain and neck pain.  Neurological: Negative for headaches.  Psychiatric/Behavioral: Negative for depression. The patient is not nervous/anxious and does not have insomnia.     Objective  Vitals:   03/31/16 0913  BP: 122/66  Pulse: 76  Resp: 15  Temp: 97.6 F (36.4 C)  TempSrc: Oral  SpO2: 97%  Weight: 138 lb (62.6 kg)  Height: 5\' 2"  (1.575 m)    Physical Exam  Constitutional:  She is oriented to person, place, and time and well-developed, well-nourished, and in no distress.  HENT:  Head: Normocephalic and atraumatic.  Right Ear: Tympanic membrane and ear canal normal.  Left Ear: Tympanic membrane and ear canal normal.  Mouth/Throat: Oropharynx is clear and moist.  Eyes: Pupils are equal, round, and reactive to light.  Neck: Neck supple.  Cardiovascular: Normal rate, regular rhythm, S1 normal, S2 normal and normal heart sounds.   No murmur heard. Pulmonary/Chest: Effort normal and breath sounds normal. She has no wheezes. She has no rhonchi.  Abdominal: Soft. Bowel sounds are normal. There is no tenderness.  Genitourinary:  Genitourinary Comments:  deferred  Neurological: She is alert and oriented to person, place, and time.  Skin: Skin is warm, dry and intact.  Psychiatric: Mood, memory, affect and judgment normal.  Nursing note and vitals reviewed.     Assessment & Plan  1. Well woman exam without gynecological exam  - CBC with Differential/Platelet - TSH - VITAMIN D 25 Hydroxy (Vit-D Deficiency, Fractures) - Lipid panel - Hepatitis C antibody  2. Post-menopausal  - DG Bone Density; Future  3. Immunization due  - Pneumococcal polysaccharide vaccine 23-valent greater than or equal to 2yo subcutaneous/IM  4. Screening for colon cancer Confirmed with insurance company and Cologuard is covered, - IT sales professional A. [facility] 03/31/2016 9:25 AM

## 2016-04-01 LAB — HEPATITIS C ANTIBODY: HCV AB: NEGATIVE

## 2016-04-01 LAB — VITAMIN D 25 HYDROXY (VIT D DEFICIENCY, FRACTURES): Vit D, 25-Hydroxy: 42 ng/mL (ref 30–100)

## 2016-04-03 ENCOUNTER — Other Ambulatory Visit: Payer: Self-pay | Admitting: Family Medicine

## 2016-04-03 ENCOUNTER — Other Ambulatory Visit: Payer: Self-pay | Admitting: Emergency Medicine

## 2016-04-03 DIAGNOSIS — Z1231 Encounter for screening mammogram for malignant neoplasm of breast: Secondary | ICD-10-CM

## 2016-04-03 MED ORDER — ROSUVASTATIN CALCIUM 5 MG PO TABS: 5.0000 mg | ORAL_TABLET | Freq: Every day | ORAL | 0 refills | Status: DC

## 2016-04-03 NOTE — Progress Notes (Unsigned)
[facility]

## 2016-04-04 ENCOUNTER — Encounter: Payer: Self-pay | Admitting: Family Medicine

## 2016-04-04 ENCOUNTER — Telehealth: Payer: Self-pay

## 2016-04-04 NOTE — Telephone Encounter (Signed)
-----   Message from Roselee Nova, MD sent at 04/04/2016  4:36 PM EST ----- Bone density test was ordered as part of routine physical exam along with an additional diagnosis of postmenopausal. ----- Message ----- From: [place]: 04/04/2016  11:54 AM To: Roselee Nova, MD  Patient stated that she spoke with her insurance company [employer] (histocompatibility and immunogenetics)) and they told her in that in order for her bone density to be covered, it must state that it is for her routine physical exam otherwise she will get another bill for $300 or so like she did the last time.  Please change her dx on the order then let me know so I can get it scheduled.  Thanks

## 2016-04-05 NOTE — Telephone Encounter (Signed)
Patient has been scheduled for her DEXA on Monday, May 08, 2016 @ 2:20pm.   Patient was informed via voicemail.

## 2016-04-05 NOTE — Telephone Encounter (Signed)
-----   Message from Roselee Nova, MD sent at 04/04/2016  4:36 PM EST ----- Bone density test was ordered as part of routine physical exam along with an additional diagnosis of postmenopausal. ----- Message ----- From: [place]: 04/04/2016  11:54 AM To: Roselee Nova, MD  Patient stated that she spoke with her insurance company [employer] (histocompatibility and immunogenetics)) and they told her in that in order for her bone density to be covered, it must state that it is for her routine physical exam otherwise she will get another bill for $300 or so like she did the last time.  Please change her dx on the order then let me know so I can get it scheduled.  Thanks

## 2016-04-21 ENCOUNTER — Encounter: Payer: Self-pay | Admitting: Family Medicine

## 2016-04-21 ENCOUNTER — Ambulatory Visit (INDEPENDENT_AMBULATORY_CARE_PROVIDER_SITE_OTHER): Payer: Medicare HMO | Admitting: Family Medicine

## 2016-04-21 VITALS — BP 119/80 | HR 96 | Temp 97.9°F | Resp 16 | Ht 62.0 in | Wt 139.7 lb

## 2016-04-21 DIAGNOSIS — E782 Mixed hyperlipidemia: Secondary | ICD-10-CM

## 2016-04-21 DIAGNOSIS — R2231 Localized swelling, mass and lump, right upper limb: Secondary | ICD-10-CM | POA: Insufficient documentation

## 2016-04-21 DIAGNOSIS — E1142 Type 2 diabetes mellitus with diabetic polyneuropathy: Secondary | ICD-10-CM | POA: Diagnosis not present

## 2016-04-21 DIAGNOSIS — I10 Essential (primary) hypertension: Secondary | ICD-10-CM

## 2016-04-21 LAB — POCT GLYCOSYLATED HEMOGLOBIN (HGB A1C): HEMOGLOBIN A1C: 7

## 2016-04-21 LAB — GLUCOSE, POCT (MANUAL RESULT ENTRY): POC Glucose: 268 mg/dl — AB (ref 70–99)

## 2016-04-21 MED ORDER — GLIPIZIDE 5 MG PO TABS: 5.0000 mg | ORAL_TABLET | Freq: Two times a day (BID) | ORAL | 3 refills | Status: DC

## 2016-04-21 MED ORDER — ROSUVASTATIN CALCIUM 5 MG PO TABS: 5.0000 mg | ORAL_TABLET | Freq: Every day | ORAL | 3 refills | Status: DC

## 2016-04-21 MED ORDER — METFORMIN HCL 1000 MG PO TABS: 1000.0000 mg | ORAL_TABLET | Freq: Two times a day (BID) | ORAL | 3 refills | Status: DC

## 2016-04-21 MED ORDER — HYDROCHLOROTHIAZIDE 12.5 MG PO TABS: 12.5000 mg | ORAL_TABLET | Freq: Every day | ORAL | 3 refills | Status: DC

## 2016-04-21 MED ORDER — LOSARTAN POTASSIUM 100 MG PO TABS: 100.0000 mg | ORAL_TABLET | Freq: Every day | ORAL | 3 refills | Status: DC

## 2016-04-21 NOTE — Progress Notes (Signed)
Name: Brooke Randall   MRN: [id number]    DOB: 1950-09-13   Date:04/21/2016       Progress Note  Subjective  Chief Complaint  Chief Complaint  Patient presents with  . Follow-up    1 MO    Diabetes  She presents for her follow-up diabetic visit. She has type 2 diabetes mellitus. Her disease course has been improving. There are no hypoglycemic associated symptoms. Pertinent negatives for diabetes include no chest pain, no fatigue, no foot paresthesias (numbness and tingling in both feet mainly at night), no polydipsia and no polyuria. Current diabetic treatment includes oral agent (dual therapy). She is compliant with treatment all of the time. Her weight is stable. She is following a diabetic and generally healthy diet. She rarely participates in exercise. She monitors blood glucose at home 3-4 x per week. There is no change in her home blood glucose trend. Her breakfast blood glucose range is generally 130-140 mg/dl. An ACE inhibitor/angiotensin II receptor blocker is being taken.   Patient has noticed a lump on the palm of right hand, initially noticed over a year ago, has grown more in the last six months to the point that it is now clearly visible above the surface of the hand. SHe has no pain, no discharge from the lump,    Past Medical History:  Diagnosis Date  . Arthritis   . Diabetes mellitus without complication ([place])   . Hypertension   . Osteoporosis     Past Surgical History:  Procedure Laterality Date  . FOOT SURGERY Right 1999    Family History  Problem Relation Age of Onset  . Hypertension Mother   . Hypertension Father   . Diabetes Father   . Hypertension Sister   . Diabetes Sister   . Hypertension Brother     Social History   Social History  . Marital status: Divorced    Spouse name: N/A  . Number of children: N/A  . Years of education: N/A   Occupational History  . Unemployed     Looking for work   Social History Main Topics  . Smoking status: Never  Smoker  . Smokeless tobacco: Never Used  . Alcohol use No  . Drug use: No  . Sexual activity: Not on file   Other Topics Concern  . Not on file   Social History Narrative  . No narrative on file     Current Outpatient Prescriptions:  .  Calcium Carbonate-Vit D-Min (CALCIUM/VITAMIN D/MINERALS) 600-200 MG-UNIT TABS, Take 1 tablet by mouth daily., Disp: , Rfl:  .  glipiZIDE (GLUCOTROL) 5 MG tablet, Take 1 tablet (5 mg total) by mouth 2 (two) times daily before a meal., Disp: 180 tablet, Rfl: 1 .  hydrochlorothiazide (HYDRODIURIL) 12.5 MG tablet, Take 1 tablet (12.5 mg total) by mouth daily., Disp: 90 tablet, Rfl: 3 .  losartan (COZAAR) 100 MG tablet, Take 1 tablet (100 mg total) by mouth daily., Disp: 90 tablet, Rfl: 3 .  metFORMIN (GLUCOPHAGE) 1000 MG tablet, Take 1 tablet (1,000 mg total) by mouth 2 (two) times daily with a meal., Disp: 180 tablet, Rfl: 0 .  rosuvastatin (CRESTOR) 5 MG tablet, Take 1 tablet (5 mg total) by mouth at bedtime., Disp: 90 tablet, Rfl: 0 .  predniSONE (STERAPRED UNI-PAK 21 TAB) 10 MG (21) TBPK tablet, Take 1 tablet (10 mg total) by mouth daily. 60 50 40 30 20 10  then STOP (Patient not taking: Reported on 03/31/2016), Disp: 21 tablet, Rfl: 0  No Known Allergies   Review of Systems  Constitutional: Negative for fatigue.  Cardiovascular: Negative for chest pain.  Endo/Heme/Allergies: Negative for polydipsia.      Objective  Vitals:   04/21/16 1130  BP: 119/80  Pulse: 96  Resp: 16  Temp: 97.9 F (36.6 C)  TempSrc: Oral  SpO2: 96%  Weight: 139 lb 11.2 oz (63.4 kg)  Height: 5\' 2"  (1.575 m)    Physical Exam  Constitutional: She is oriented to person, place, and time and well-developed, well-nourished, and in no distress.  HENT:  Head: Normocephalic and atraumatic.  Cardiovascular: Normal rate, regular rhythm and normal heart sounds.   No murmur heard. Pulmonary/Chest: Effort normal and breath sounds normal. She has no wheezes.  Abdominal:  Soft. Bowel sounds are normal.  Musculoskeletal: She exhibits no edema.       Hands: Small non tender non indurated raised mass on the palm of right hand   Neurological: She is alert and oriented to person, place, and time.  Psychiatric: Mood, memory, affect and judgment normal.  Nursing note and vitals reviewed.      Assessment & Plan  1. Well controlled type 2 diabetes mellitus with peripheral neuropathy (HCC) A1c at goal at 7.0%, continue on glipizide and metformin as prescribed, refills provided - POCT HgB A1C - POCT Glucose (CBG) - Urine Microalbumin w/creat. ratio - glipiZIDE (GLUCOTROL) 5 MG tablet; Take 1 tablet (5 mg total) by mouth 2 (two) times daily before a meal.  Dispense: 180 tablet; Refill: 3 - metFORMIN (GLUCOPHAGE) 1000 MG tablet; Take 1 tablet (1,000 mg total) by mouth 2 (two) times daily with a meal.  Dispense: 180 tablet; Refill: 3  2. Mixed hyperlipidemia She has not been taking rosuvastatin concerned about side effects. Reassured and advised to restart an rosuvastatin, follow-up in 3 months - rosuvastatin (CRESTOR) 5 MG tablet; Take 1 tablet (5 mg total) by mouth at bedtime.  Dispense: 90 tablet; Refill: 3  3. Mass of right hand  - Ambulatory referral to Orthopedic Surgery  4. Essential hypertension BP stable on present antihypertensive therapy - hydrochlorothiazide (HYDRODIURIL) 12.5 MG tablet; Take 1 tablet (12.5 mg total) by mouth daily.  Dispense: 90 tablet; Refill: 3 - losartan (COZAAR) 100 MG tablet; Take 1 tablet (100 mg total) by mouth daily.  Dispense: 90 tablet; Refill: 3   Brenda Cowher Asad [facility] 04/21/2016 11:36 AM

## 2016-04-21 NOTE — Addendum Note (Signed)
Addended byManuella Ghazi, Calyb Mcquarrie A A on: 04/21/2016 12:01 PM   Modules accepted: Orders

## 2016-05-03 ENCOUNTER — Ambulatory Visit
Admission: RE | Admit: 2016-05-03 | Discharge: 2016-05-03 | Disposition: A | Discharge status: Home / Self Care | Payer: Medicare HMO | Source: Ambulatory Visit | Attending: Family Medicine | Admitting: Family Medicine

## 2016-05-03 ENCOUNTER — Encounter: Payer: Self-pay | Admitting: Obstetrics and Gynecology

## 2016-05-03 ENCOUNTER — Ambulatory Visit (INDEPENDENT_AMBULATORY_CARE_PROVIDER_SITE_OTHER): Payer: Medicare HMO | Admitting: Obstetrics and Gynecology

## 2016-05-03 ENCOUNTER — Other Ambulatory Visit: Payer: Self-pay | Admitting: Family Medicine

## 2016-05-03 ENCOUNTER — Ambulatory Visit: Payer: Self-pay

## 2016-05-03 VITALS — BP 134/84 | Ht 63.0 in | Wt 142.0 lb

## 2016-05-03 DIAGNOSIS — Z1231 Encounter for screening mammogram for malignant neoplasm of breast: Secondary | ICD-10-CM | POA: Diagnosis not present

## 2016-05-03 DIAGNOSIS — Z01419 Encounter for gynecological examination (general) (routine) without abnormal findings: Secondary | ICD-10-CM

## 2016-05-03 DIAGNOSIS — Z124 Encounter for screening for malignant neoplasm of cervix: Secondary | ICD-10-CM | POA: Diagnosis not present

## 2016-05-03 NOTE — Progress Notes (Signed)
Routine Annual Gynecology Examination   PCP: Keith Rake, MD  Chief Complaint:  Chief Complaint  Patient presents with  . Annual Exam    History of Present Illness: Patient is a 66 y.o. G2P2002 presents for annual exam. The patient has no complaints today.   Menopausal bleeding: denies  Menopausal symptoms: denies  Breast symptoms: denies  Last pap smear: 2 years ago.  Result Normal  Last mammogram: 2 years ago.  Result Normal  Past Medical History:  Diagnosis Date  . Arthritis   . Diabetes mellitus without complication ([place])   . Hypertension   . Osteoporosis    Past Surgical History:  Procedure Laterality Date  . FOOT SURGERY Right 1999    Medications: Prior to Admission medications   Medication Sig Start Date End Date Taking? Authorizing Provider  Calcium Carbonate-Vit D-Min (CALCIUM/VITAMIN D/MINERALS) 600-200 MG-UNIT TABS Take 1 tablet by mouth daily.    Historical Provider, MD  glipiZIDE (GLUCOTROL) 5 MG tablet Take 1 tablet (5 mg total) by mouth 2 (two) times daily before a meal. 04/21/16   Roselee Nova, MD  hydrochlorothiazide (HYDRODIURIL) 12.5 MG tablet Take 1 tablet (12.5 mg total) by mouth daily. 04/21/16   Roselee Nova, MD  losartan (COZAAR) 100 MG tablet Take 1 tablet (100 mg total) by mouth daily. 04/21/16   Roselee Nova, MD  metFORMIN (GLUCOPHAGE) 1000 MG tablet Take 1 tablet (1,000 mg total) by mouth 2 (two) times daily with a meal. 04/21/16   Roselee Nova, MD  rosuvastatin (CRESTOR) 5 MG tablet Take 1 tablet (5 mg total) by mouth at bedtime. 04/21/16   Roselee Nova, MD    Allergies:  No Known Allergies  Gynecologic History:  No LMP recorded. Patient is postmenopausal.  Obstetric History: [id number]  Social History   Social History  . Marital status: Divorced    Spouse name: N/A  . Number of children: N/A  . Years of education: N/A   Occupational History  . Unemployed     Looking for work   Social History Main Topics  .  Smoking status: Never Smoker  . Smokeless tobacco: Never Used  . Alcohol use No  . Drug use: No  . Sexual activity: Not Currently    Birth control/ protection: Post-menopausal   Other Topics Concern  . Not on file   Social History Narrative  . No narrative on file   Family History  Problem Relation Age of Onset  . Hypertension Mother   . Hypertension Father   . Diabetes Father   . Hypertension Sister   . Diabetes Sister   . Hypertension Brother     Review of Systems  Constitutional: Negative.   HENT: Negative.   Eyes: Negative.   Respiratory: Negative.   Cardiovascular: Negative.   Gastrointestinal: Negative.   Genitourinary: Negative.   Musculoskeletal: Negative.   Skin: Negative.   Neurological: Negative.   Psychiatric/Behavioral: Negative.      Physical Exam Vitals: BP 134/84   Ht 5\' 3"  (1.6 m)   Wt 142 lb (64.4 kg)   BMI 25.15 kg/m   Physical Exam  Constitutional: She is oriented to person, place, and time and well-developed, well-nourished, and in no distress. No distress.  HENT:  Head: Normocephalic and atraumatic.  Eyes: Conjunctivae are normal. Left eye exhibits no discharge. No scleral icterus.  Neck: Normal range of motion. Neck supple.  Cardiovascular: Normal rate and regular rhythm.  Exam reveals no gallop  and no friction rub.   No murmur heard. Pulmonary/Chest: Effort normal and breath sounds normal. No respiratory distress. She has no wheezes. She has no rales. Right breast exhibits no inverted nipple, no mass, no nipple discharge, no skin change and no tenderness. Left breast exhibits no inverted nipple, no mass, no nipple discharge, no skin change and no tenderness. Breasts are symmetrical.  Abdominal: Soft. Bowel sounds are normal. She exhibits no distension and no mass. There is no tenderness. There is no rebound and no guarding.  Genitourinary: Vagina normal, uterus normal, cervix normal, right adnexa normal, left adnexa normal and vulva normal.  Uterus is not deviated, not enlarged, not fixed and not tender.  Cervix is not fixed. Cervix exhibits no motion tenderness, no lesion and no tenderness. Right adnexum displays no deviation, no mass and no tenderness. Left adnexum displays no deviation, no mass and no tenderness. Vulva exhibits no erythema, no exudate, no lesion, no rash and no tenderness.  Musculoskeletal: Normal range of motion. She exhibits no edema.  Lymphadenopathy:    She has no cervical adenopathy.  Neurological: She is alert and oriented to person, place, and time. No cranial nerve deficit.  Skin: Skin is warm and dry. No rash noted.  Psychiatric: Judgment normal.   Female chaperone present for pelvic and breast  portions of the physical exam  Results: AUDIT Questionnaire (screen for alcoholism): 0 PHQ-9: 2   Assessment and Plan:  66 y.o. [id number] here for annual gynecologic exam with no issues.   Plan:  1) Mammogram  - recommend yearly screening mammogram. Patient states that she obtained her mammogram today.  2) STI screening was offered and not done  3) Pap smear - ASCCP guidelines and rational discussed.  Patient opts for routine screening interval. This is likely her last pap smear given her age. - Pap  done  4) Osteoporosis  - per USPTF routine screening DEXA at age 33  5) Routine healthcare maintenance including cholesterol, diabetes screening per her PCP  6) Colonoscopy- last 5 years ago.  Continue per her PCP.   7) Follow up 1 year for routine annual  Prentice Docker, MD 05/03/2016 3:33 PM

## 2016-05-08 ENCOUNTER — Ambulatory Visit
Admission: RE | Admit: 2016-05-08 | Discharge: 2016-05-08 | Disposition: A | Discharge status: Home / Self Care | Payer: Medicare HMO | Source: Ambulatory Visit | Attending: Family Medicine | Admitting: Family Medicine

## 2016-05-08 ENCOUNTER — Encounter: Payer: Self-pay | Admitting: Obstetrics and Gynecology

## 2016-05-08 DIAGNOSIS — Z780 Asymptomatic menopausal state: Secondary | ICD-10-CM | POA: Diagnosis not present

## 2016-05-08 DIAGNOSIS — M8588 Other specified disorders of bone density and structure, other site: Secondary | ICD-10-CM | POA: Diagnosis not present

## 2016-05-08 LAB — PAP IG (IMAGE GUIDED): PAP Smear Comment: 0

## 2016-07-05 DIAGNOSIS — Z0101 Encounter for examination of eyes and vision with abnormal findings: Secondary | ICD-10-CM | POA: Diagnosis not present

## 2016-07-17 DIAGNOSIS — Z1212 Encounter for screening for malignant neoplasm of rectum: Secondary | ICD-10-CM | POA: Diagnosis not present

## 2016-07-17 DIAGNOSIS — Z1211 Encounter for screening for malignant neoplasm of colon: Secondary | ICD-10-CM | POA: Diagnosis not present

## 2016-07-24 ENCOUNTER — Ambulatory Visit (INDEPENDENT_AMBULATORY_CARE_PROVIDER_SITE_OTHER): Payer: Medicare HMO | Admitting: Family Medicine

## 2016-07-24 ENCOUNTER — Encounter: Payer: Self-pay | Admitting: Family Medicine

## 2016-07-24 VITALS — BP 132/84 | HR 81 | Temp 98.7°F | Resp 16 | Ht 63.0 in | Wt 134.8 lb

## 2016-07-24 DIAGNOSIS — E782 Mixed hyperlipidemia: Secondary | ICD-10-CM

## 2016-07-24 DIAGNOSIS — E1142 Type 2 diabetes mellitus with diabetic polyneuropathy: Secondary | ICD-10-CM | POA: Diagnosis not present

## 2016-07-24 DIAGNOSIS — I10 Essential (primary) hypertension: Secondary | ICD-10-CM | POA: Diagnosis not present

## 2016-07-24 LAB — POCT GLYCOSYLATED HEMOGLOBIN (HGB A1C): Hemoglobin A1C: 6.9

## 2016-07-24 LAB — GLUCOSE, POCT (MANUAL RESULT ENTRY): POC GLUCOSE: 236 mg/dL — AB (ref 70–99)

## 2016-07-24 NOTE — Progress Notes (Signed)
Name: Brooke Randall   MRN: [id number]    DOB: Oct 15, 1950   Date:07/24/2016       Progress Note  Subjective  Chief Complaint  Chief Complaint  Patient presents with  . Diabetes    3 month follow up  . Hypertension    Diabetes  She presents for her follow-up diabetic visit. She has type 2 diabetes mellitus. Her disease course has been improving. Hypoglycemia symptoms include nervousness/anxiousness and sweats. Pertinent negatives for hypoglycemia include no dizziness or headaches. Pertinent negatives for diabetes include no blurred vision, no chest pain, no fatigue, no foot paresthesias, no polyphagia and no polyuria. There are no hypoglycemic complications. Symptoms are stable. Pertinent negatives for diabetic complications include no CVA. Current diabetic treatment includes oral agent (dual therapy). She is following a diabetic diet. She has not had a previous visit with a dietitian. Home blood sugar record trend: not checking her blood glucose. An ACE inhibitor/angiotensin II receptor blocker is being taken.  Hypertension  This is a chronic problem. The problem is unchanged. The problem is controlled. Associated symptoms include sweats. Pertinent negatives include no blurred vision, chest pain, headaches or shortness of breath. Past treatments include angiotensin blockers and diuretics. Compliance problems: taking HCTZ and Losartan not regularly.  There is no history of kidney disease, CAD/MI or CVA.  Hyperlipidemia  This is a chronic problem. The problem is uncontrolled. Pertinent negatives include no chest pain, leg pain, myalgias or shortness of breath. Current antihyperlipidemic treatment includes statins.     Past Medical History:  Diagnosis Date  . Arthritis   . Diabetes mellitus without complication ([place])   . Hypertension   . Osteoporosis     Past Surgical History:  Procedure Laterality Date  . FOOT SURGERY Right 1999    Family History  Problem Relation Age of Onset  .  Hypertension Mother   . Hypertension Father   . Diabetes Father   . Hypertension Sister   . Diabetes Sister   . Hypertension Brother     Social History   Social History  . Marital status: Divorced    Spouse name: N/A  . Number of children: N/A  . Years of education: N/A   Occupational History  . Unemployed     Looking for work   Social History Main Topics  . Smoking status: Never Smoker  . Smokeless tobacco: Never Used  . Alcohol use No  . Drug use: No  . Sexual activity: Not Currently    Birth control/ protection: Post-menopausal   Other Topics Concern  . Not on file   Social History Narrative  . No narrative on file     Current Outpatient Prescriptions:  .  Calcium Carbonate-Vit D-Min (CALCIUM/VITAMIN D/MINERALS) 600-200 MG-UNIT TABS, Take 1 tablet by mouth daily., Disp: , Rfl:  .  glipiZIDE (GLUCOTROL) 5 MG tablet, Take 1 tablet (5 mg total) by mouth 2 (two) times daily before a meal., Disp: 180 tablet, Rfl: 3 .  hydrochlorothiazide (HYDRODIURIL) 12.5 MG tablet, Take 1 tablet (12.5 mg total) by mouth daily., Disp: 90 tablet, Rfl: 3 .  losartan (COZAAR) 100 MG tablet, Take 1 tablet (100 mg total) by mouth daily., Disp: 90 tablet, Rfl: 3 .  metFORMIN (GLUCOPHAGE) 1000 MG tablet, Take 1 tablet (1,000 mg total) by mouth 2 (two) times daily with a meal., Disp: 180 tablet, Rfl: 3 .  rosuvastatin (CRESTOR) 5 MG tablet, Take 1 tablet (5 mg total) by mouth at bedtime., Disp: 90 tablet, Rfl: 3  No  Known Allergies   Review of Systems  Constitutional: Negative for fatigue.  Eyes: Negative for blurred vision.  Respiratory: Negative for shortness of breath.   Cardiovascular: Negative for chest pain.  Musculoskeletal: Negative for myalgias.  Neurological: Negative for dizziness and headaches.  Endo/Heme/Allergies: Negative for polyphagia.  Psychiatric/Behavioral: The patient is nervous/anxious.      Objective  Vitals:   07/24/16 1330  BP: 132/84  Pulse: 81  Resp:  16  Temp: 98.7 F (37.1 C)  TempSrc: Oral  SpO2: 97%  Weight: 134 lb 12.8 oz (61.1 kg)  Height: 5\' 3"  (1.6 m)    Physical Exam  Constitutional: She is oriented to person, place, and time and well-developed, well-nourished, and in no distress.  HENT:  Head: Normocephalic and atraumatic.  Cardiovascular: Normal rate, regular rhythm and normal heart sounds.   No murmur heard. Pulmonary/Chest: Effort normal and breath sounds normal. She has no wheezes.  Abdominal: Soft. Bowel sounds are normal. There is no tenderness.  Musculoskeletal: She exhibits no edema.  Neurological: She is alert and oriented to person, place, and time.  Psychiatric: Mood, memory, affect and judgment normal.  Nursing note and vitals reviewed.      Assessment & Plan  1. Well controlled type 2 diabetes mellitus with peripheral neuropathy (HCC)   A1c 6.9%, well-controlled diabetes, continue with present management - POCT HgB A1C - POCT Glucose (CBG) - Urine Microalbumin w/creat. ratio  2. Mixed hyperlipidemia Obtain FLP and adjust statin if appropriate - Lipid panel - COMPLETE METABOLIC PANEL WITH GFR  3. Essential hypertension Patient's blood pressure has been normal and she has not been taking losartan and hydrochlorothiazide regularly, will DC hydrochlorothiazide and continue on Cozaar every day, patient advised to check blood pressure.   Rodrick Payson [facility] 07/24/2016 1:48 PM

## 2016-07-25 LAB — COLOGUARD: COLOGUARD: NEGATIVE

## 2016-07-26 ENCOUNTER — Encounter: Payer: Self-pay | Admitting: Family Medicine

## 2016-08-03 DIAGNOSIS — E1142 Type 2 diabetes mellitus with diabetic polyneuropathy: Secondary | ICD-10-CM | POA: Diagnosis not present

## 2016-08-03 DIAGNOSIS — E782 Mixed hyperlipidemia: Secondary | ICD-10-CM | POA: Diagnosis not present

## 2016-08-10 ENCOUNTER — Telehealth: Payer: Self-pay | Admitting: Family Medicine

## 2016-08-11 ENCOUNTER — Telehealth: Payer: Self-pay | Admitting: Family Medicine

## 2016-08-11 NOTE — Telephone Encounter (Signed)
Pt calling for lab results.

## 2016-08-12 NOTE — Telephone Encounter (Signed)
Result comments were written on lab work obtained from [facility]. Please review.

## 2016-10-09 DIAGNOSIS — R69 Illness, unspecified: Secondary | ICD-10-CM | POA: Diagnosis not present

## 2016-10-31 DIAGNOSIS — R69 Illness, unspecified: Secondary | ICD-10-CM | POA: Diagnosis not present

## 2016-11-22 ENCOUNTER — Encounter: Payer: Self-pay | Admitting: Family Medicine

## 2016-11-22 ENCOUNTER — Ambulatory Visit (INDEPENDENT_AMBULATORY_CARE_PROVIDER_SITE_OTHER): Payer: Medicare HMO | Admitting: Family Medicine

## 2016-11-22 VITALS — BP 128/82 | HR 78 | Temp 97.3°F | Resp 16 | Ht 63.0 in | Wt 139.5 lb

## 2016-11-22 DIAGNOSIS — I10 Essential (primary) hypertension: Secondary | ICD-10-CM | POA: Diagnosis not present

## 2016-11-22 DIAGNOSIS — G4700 Insomnia, unspecified: Secondary | ICD-10-CM | POA: Diagnosis not present

## 2016-11-22 DIAGNOSIS — E1142 Type 2 diabetes mellitus with diabetic polyneuropathy: Secondary | ICD-10-CM | POA: Diagnosis not present

## 2016-11-22 DIAGNOSIS — E782 Mixed hyperlipidemia: Secondary | ICD-10-CM | POA: Diagnosis not present

## 2016-11-22 DIAGNOSIS — Z23 Encounter for immunization: Secondary | ICD-10-CM

## 2016-11-22 LAB — GLUCOSE, POCT (MANUAL RESULT ENTRY): POC Glucose: 171 mg/dl — AB (ref 70–99)

## 2016-11-22 LAB — POCT GLYCOSYLATED HEMOGLOBIN (HGB A1C): Hemoglobin A1C: 7.1

## 2016-11-22 MED ORDER — LOSARTAN POTASSIUM-HCTZ 100-12.5 MG PO TABS: 1.0000 | ORAL_TABLET | Freq: Every day | ORAL | 3 refills | Status: DC

## 2016-11-22 MED ORDER — TRAZODONE HCL 50 MG PO TABS: 25.0000 mg | ORAL_TABLET | Freq: Every evening | ORAL | 2 refills | Status: DC | PRN

## 2016-11-22 NOTE — Progress Notes (Signed)
Name: Brooke Randall   MRN: [id number]    DOB: Jun 24, 1950   Date:11/22/2016       Progress Note  Subjective  Chief Complaint  Chief Complaint  Patient presents with  . Diabetes    3 month follow up  . Hypertension    Diabetes  She presents for her follow-up diabetic visit. She has type 2 diabetes mellitus. Her disease course has been improving. Hypoglycemia symptoms include nervousness/anxiousness (jitteriness and nervousness in last 10 days, now feeling better.). Pertinent negatives for hypoglycemia include no headaches or seizures. Associated symptoms include fatigue and foot paresthesias (tingling and burning in feet). Pertinent negatives for diabetes include no blurred vision, no chest pain, no polydipsia and no polyuria. Symptoms are stable. Diabetic complications include peripheral neuropathy. Pertinent negatives for diabetic complications include no CVA or heart disease. Current diabetic treatment includes oral agent (dual therapy). She is following a generally healthy diet. She has not had a previous visit with a dietitian. She participates in exercise three times a week (walks every day). Frequency home blood tests: not checking her blood glucose regularly. An ACE inhibitor/angiotensin II receptor blocker is not being taken.  Hypertension  This is a chronic problem. The problem is unchanged. The problem is controlled. Pertinent negatives include no blurred vision, chest pain, headaches, palpitations or shortness of breath. Past treatments include angiotensin blockers and diuretics. There is no history of kidney disease, CAD/MI or CVA.  Hyperlipidemia  This is a chronic problem. The problem is uncontrolled. Recent lipid tests were reviewed and are high. Pertinent negatives include no chest pain, leg pain, myalgias or shortness of breath. Current antihyperlipidemic treatment includes statins.  Insomnia  Primary symptoms: no difficulty falling asleep, frequent awakening, premature morning  awakening.  The onset quality is gradual. The problem occurs nightly. The symptoms are aggravated by anxiety. Past treatments include nothing. Typical bedtime:  11-12 P.M..  How long after going to bed to you fall asleep: 15-30 minutes.       Past Medical History:  Diagnosis Date  . Arthritis   . Diabetes mellitus without complication ([place])   . Hypertension   . Osteoporosis     Past Surgical History:  Procedure Laterality Date  . FOOT SURGERY Right 1999    Family History  Problem Relation Age of Onset  . Hypertension Mother   . Hypertension Father   . Diabetes Father   . Hypertension Sister   . Diabetes Sister   . Hypertension Brother     Social History   Social History  . Marital status: Divorced    Spouse name: N/A  . Number of children: N/A  . Years of education: N/A   Occupational History  . Unemployed     Looking for work   Social History Main Topics  . Smoking status: Never Smoker  . Smokeless tobacco: Never Used  . Alcohol use No  . Drug use: No  . Sexual activity: Not Currently    Birth control/ protection: Post-menopausal   Other Topics Concern  . Not on file   Social History Narrative  . No narrative on file     Current Outpatient Prescriptions:  .  Calcium Carbonate-Vit D-Min (CALCIUM/VITAMIN D/MINERALS) 600-200 MG-UNIT TABS, Take 1 tablet by mouth daily., Disp: , Rfl:  .  glipiZIDE (GLUCOTROL) 5 MG tablet, Take 1 tablet (5 mg total) by mouth 2 (two) times daily before a meal., Disp: 180 tablet, Rfl: 3 .  losartan (COZAAR) 100 MG tablet, Take 1 tablet (100 mg  total) by mouth daily., Disp: 90 tablet, Rfl: 3 .  metFORMIN (GLUCOPHAGE) 1000 MG tablet, Take 1 tablet (1,000 mg total) by mouth 2 (two) times daily with a meal., Disp: 180 tablet, Rfl: 3 .  rosuvastatin (CRESTOR) 5 MG tablet, Take 1 tablet (5 mg total) by mouth at bedtime., Disp: 90 tablet, Rfl: 3  No Known Allergies   Review of Systems  Constitutional: Positive for fatigue.  Eyes:  Negative for blurred vision.  Respiratory: Negative for shortness of breath.   Cardiovascular: Negative for chest pain and palpitations.  Musculoskeletal: Negative for myalgias.  Neurological: Negative for seizures and headaches.  Endo/Heme/Allergies: Negative for polydipsia.  Psychiatric/Behavioral: The patient is nervous/anxious (jitteriness and nervousness in last 10 days, now feeling better.) and has insomnia.      Objective  Vitals:   11/22/16 1512  BP: 128/82  Pulse: 78  Resp: 16  Temp: (!) 97.3 F (36.3 C)  TempSrc: Oral  SpO2: 98%  Weight: 139 lb 8 oz (63.3 kg)  Height: 5\' 3"  (1.6 m)    Physical Exam  Constitutional: She is oriented to person, place, and time and well-developed, well-nourished, and in no distress.  HENT:  Head: Normocephalic and atraumatic.  Cardiovascular: Normal rate, regular rhythm and normal heart sounds.   No murmur heard. Pulmonary/Chest: Effort normal and breath sounds normal. She has no wheezes.  Abdominal: Soft. Bowel sounds are normal. There is no tenderness.  Musculoskeletal: She exhibits no edema.  Neurological: She is alert and oriented to person, place, and time.  Psychiatric: Mood, memory, affect and judgment normal.  Nursing note and vitals reviewed.      Recent Results (from the past 2160 hour(s))  POCT HgB A1C     Status: None   Collection Time: 11/22/16  3:20 PM  Result Value Ref Range   Hemoglobin A1C 7.1   POCT Glucose (CBG)     Status: Abnormal   Collection Time: 11/22/16  3:20 PM  Result Value Ref Range   POC Glucose 171 (A) 70 - 99 mg/dl     Assessment & Plan  1. Well controlled type 2 diabetes mellitus with peripheral neuropathy (HCC)   A1c 7.1%, well-controlled diabetes, no change in treatment - POCT HgB A1C - POCT Glucose (CBG)  2. Essential hypertension BP stable on present antihypertensive treatment - losartan-hydrochlorothiazide (HYZAAR) 100-12.5 MG tablet; Take 1 tablet by mouth daily.  Dispense:  90 tablet; Refill: 3  3. Insomnia, unspecified type Heart on low-dose trazodone for insomnia, likely secondary to anxiety - traZODone (DESYREL) 50 MG tablet; Take 0.5-1 tablets (25-50 mg total) by mouth at bedtime as needed for sleep.  Dispense: 90 tablet; Refill: 2  4. Mixed hyperlipidemia  - Lipid panel - COMPLETE METABOLIC PANEL WITH GFR  5. Flu vaccine need  - Flu vaccine HIGH DOSE PF (Fluzone High dose)  Rozann Holts [facility] 11/22/2016 3:29 PM

## 2016-11-23 DIAGNOSIS — R69 Illness, unspecified: Secondary | ICD-10-CM | POA: Diagnosis not present

## 2016-11-24 DIAGNOSIS — R69 Illness, unspecified: Secondary | ICD-10-CM | POA: Diagnosis not present

## 2017-01-08 ENCOUNTER — Ambulatory Visit (INDEPENDENT_AMBULATORY_CARE_PROVIDER_SITE_OTHER): Payer: Medicare HMO | Admitting: Family Medicine

## 2017-01-08 ENCOUNTER — Encounter: Payer: Self-pay | Admitting: Family Medicine

## 2017-01-08 VITALS — BP 146/82 | HR 85 | Temp 98.4°F | Resp 14 | Wt 137.8 lb

## 2017-01-08 DIAGNOSIS — E1142 Type 2 diabetes mellitus with diabetic polyneuropathy: Secondary | ICD-10-CM

## 2017-01-08 MED ORDER — PREGABALIN 50 MG PO CAPS: 50.0000 mg | ORAL_CAPSULE | Freq: Three times a day (TID) | ORAL | 2 refills | Status: DC

## 2017-01-08 NOTE — Progress Notes (Signed)
Name: Brooke Randall   MRN: [id number]    DOB: 03-11-1950   Date:01/08/2017       Progress Note  Subjective  Chief Complaint  Chief Complaint  Patient presents with  . Foot Pain    Both feet are in pain but the right one is worst; going on for a year. Last at night both feet are in oain but during the day is mostly the right     Foot Pain  This is a chronic problem. The current episode started more than 1 year ago. The problem occurs intermittently. The problem has been gradually worsening.   Has diabetic peripheral neuropathy, described as numbness and tingling in both feet Right worse than Left, she has tried Gabapentin which did not help. Over the last 2-3 months, she has noticed the pain has gotten worse, she describes the sensation as 'electric shock' or 'drilling' type sensation. She has seen podiatrist over the past year, was diagnosed with Neuroma and suggested surgery for the same.  She had surgery to repair a 'bone spur' over the right 5 th toe and according to the patient, the podiatrist did not fix it correctly and she now has pain and redness in the lateral 5th toe.    Past Medical History:  Diagnosis Date  . Arthritis   . Diabetes mellitus without complication ([place])   . Hypertension   . Osteoporosis     Past Surgical History:  Procedure Laterality Date  . FOOT SURGERY Right 1999    Family History  Problem Relation Age of Onset  . Hypertension Mother   . Hypertension Father   . Diabetes Father   . Hypertension Sister   . Diabetes Sister   . Hypertension Brother     Social History   Socioeconomic History  . Marital status: Divorced    Spouse name: Not on file  . Number of children: Not on file  . Years of education: Not on file  . Highest education level: Not on file  Social Needs  . Financial resource strain: Not on file  . Food insecurity - worry: Not on file  . Food insecurity - inability: Not on file  . Transportation needs - medical: Not on file    . Transportation needs - non-medical: Not on file  Occupational History  . Occupation: Unemployed    Comment: Looking for work  Tobacco Use  . Smoking status: Never Smoker  . Smokeless tobacco: Never Used  Substance and Sexual Activity  . Alcohol use: No    Alcohol/week: 0.0 oz  . Drug use: No  . Sexual activity: Not Currently    Birth control/protection: Post-menopausal  Other Topics Concern  . Not on file  Social History Narrative  . Not on file     Current Outpatient Medications:  .  Calcium Carbonate-Vit D-Min (CALCIUM/VITAMIN D/MINERALS) 600-200 MG-UNIT TABS, Take 1 tablet by mouth daily., Disp: , Rfl:  .  glipiZIDE (GLUCOTROL) 5 MG tablet, Take 1 tablet (5 mg total) by mouth 2 (two) times daily before a meal., Disp: 180 tablet, Rfl: 3 .  losartan-hydrochlorothiazide (HYZAAR) 100-12.5 MG tablet, Take 1 tablet by mouth daily., Disp: 90 tablet, Rfl: 3 .  metFORMIN (GLUCOPHAGE) 1000 MG tablet, Take 1 tablet (1,000 mg total) by mouth 2 (two) times daily with a meal., Disp: 180 tablet, Rfl: 3 .  rosuvastatin (CRESTOR) 5 MG tablet, Take 1 tablet (5 mg total) by mouth at bedtime., Disp: 90 tablet, Rfl: 3 .  traZODone (DESYREL) 50  MG tablet, Take 0.5-1 tablets (25-50 mg total) by mouth at bedtime as needed for sleep., Disp: 90 tablet, Rfl: 2 .  meloxicam (MOBIC) 15 MG tablet, Take 1 tablet by mouth every other day. AS NEEDED, Disp: , Rfl:   No Known Allergies   ROS   Objective  Vitals:   01/08/17 1542  BP: (!) 146/82  Pulse: 85  Resp: 14  Temp: 98.4 F (36.9 C)  TempSrc: Oral  SpO2: 98%  Weight: 137 lb 12.8 oz (62.5 kg)    Physical Exam  Constitutional: She is oriented to person, place, and time and well-developed, well-nourished, and in no distress.  Cardiovascular: Normal rate.  Musculoskeletal: She exhibits no edema.       Feet:  Neurological: She is alert and oriented to person, place, and time.  Nursing note and vitals reviewed.      Recent Results  (from the past 2160 hour(s))  POCT HgB A1C     Status: None   Collection Time: 11/22/16  3:20 PM  Result Value Ref Range   Hemoglobin A1C 7.1   POCT Glucose (CBG)     Status: Abnormal   Collection Time: 11/22/16  3:20 PM  Result Value Ref Range   POC Glucose 171 (A) 70 - 99 mg/dl     Assessment & Plan  1. Well controlled type 2 diabetes mellitus with peripheral neuropathy ([place]) We will start Lyrica 50 mg 3 times daily, will contact podiatry and diabetic shoe supply store to discuss patient's need for a diabetic shoe - pregabalin (LYRICA) 50 MG capsule; Take 1 capsule (50 mg total) by mouth 3 (three) times daily.  Dispense: 90 capsule; Refill: 2   Yates Weisgerber [facility] 01/08/2017 3:58 PM

## 2017-01-09 DIAGNOSIS — R69 Illness, unspecified: Secondary | ICD-10-CM | POA: Diagnosis not present

## 2017-02-22 NOTE — Telephone Encounter (Signed)
CRM entered

## 2017-02-27 ENCOUNTER — Ambulatory Visit (INDEPENDENT_AMBULATORY_CARE_PROVIDER_SITE_OTHER): Payer: Medicare HMO

## 2017-02-27 ENCOUNTER — Ambulatory Visit (INDEPENDENT_AMBULATORY_CARE_PROVIDER_SITE_OTHER): Payer: Medicare HMO | Admitting: Family Medicine

## 2017-02-27 ENCOUNTER — Encounter: Payer: Self-pay | Admitting: Family Medicine

## 2017-02-27 VITALS — BP 145/80 | HR 78 | Temp 98.4°F | Resp 16 | Ht 63.0 in | Wt 137.0 lb

## 2017-02-27 VITALS — BP 148/80 | HR 78 | Temp 98.4°F | Resp 12 | Ht 63.0 in | Wt 137.7 lb

## 2017-02-27 DIAGNOSIS — M170 Bilateral primary osteoarthritis of knee: Secondary | ICD-10-CM

## 2017-02-27 DIAGNOSIS — I10 Essential (primary) hypertension: Secondary | ICD-10-CM | POA: Diagnosis not present

## 2017-02-27 DIAGNOSIS — Z0000 Encounter for general adult medical examination without abnormal findings: Secondary | ICD-10-CM | POA: Diagnosis not present

## 2017-02-27 DIAGNOSIS — E782 Mixed hyperlipidemia: Secondary | ICD-10-CM | POA: Diagnosis not present

## 2017-02-27 DIAGNOSIS — E1142 Type 2 diabetes mellitus with diabetic polyneuropathy: Secondary | ICD-10-CM

## 2017-02-27 LAB — GLUCOSE, POCT (MANUAL RESULT ENTRY): POC GLUCOSE: 144 mg/dL — AB (ref 70–99)

## 2017-02-27 LAB — POCT GLYCOSYLATED HEMOGLOBIN (HGB A1C): Hemoglobin A1C: 7

## 2017-02-27 MED ORDER — GLIPIZIDE 5 MG PO TABS: 5.0000 mg | ORAL_TABLET | Freq: Two times a day (BID) | ORAL | 3 refills | Status: DC

## 2017-02-27 MED ORDER — ROSUVASTATIN CALCIUM 5 MG PO TABS: 5.0000 mg | ORAL_TABLET | Freq: Every day | ORAL | 3 refills | Status: DC

## 2017-02-27 MED ORDER — METFORMIN HCL 1000 MG PO TABS: 1000.0000 mg | ORAL_TABLET | Freq: Two times a day (BID) | ORAL | 3 refills | Status: DC

## 2017-02-27 MED ORDER — TELMISARTAN-HCTZ 80-25 MG PO TABS: 1.0000 | ORAL_TABLET | Freq: Every day | ORAL | 0 refills | Status: DC

## 2017-02-27 MED ORDER — MELOXICAM 15 MG PO TABS: 15.0000 mg | ORAL_TABLET | ORAL | 0 refills | Status: DC

## 2017-02-27 NOTE — Progress Notes (Signed)
Name: Brooke Randall   MRN: [id number]    DOB: 06/09/1950   Date:02/27/2017       Progress Note  Subjective  Chief Complaint  Chief Complaint  Patient presents with  . Diabetes    Pt do not check her sugars bu she states sometime she take her metformin in the morning she feels little shaking   . Hyperlipidemia    Pt tries to maintain diet   . Hypertension    Pt denies any issues  . Medication Refill    Diabetes  She presents for her follow-up diabetic visit. She has type 2 diabetes mellitus. Her disease course has been stable. Hypoglycemia symptoms include dizziness, nervousness/anxiousness and sweats. Pertinent negatives for hypoglycemia include no headaches. (Especially when she does not eat enough at breakfast) Associated symptoms include foot paresthesias. Pertinent negatives for diabetes include no blurred vision, no chest pain, no fatigue, no polydipsia and no polyuria. Diabetic complications include peripheral neuropathy. Pertinent negatives for diabetic complications include no CVA or heart disease. Current diabetic treatment includes oral agent (dual therapy). She is following a generally healthy diet. She rarely (has been visiting her family in [place] and so not able to exercise.) participates in exercise. She monitors blood glucose at home 1-2 x per week. Her breakfast blood glucose range is generally 140-180 mg/dl. An ACE inhibitor/angiotensin II receptor blocker is being taken. Eye exam is current.  Hyperlipidemia  This is a chronic problem. The problem is uncontrolled. Recent lipid tests were reviewed and are high. Pertinent negatives include no chest pain, leg pain, myalgias or shortness of breath. Current antihyperlipidemic treatment includes statins. Risk factors for coronary artery disease include dyslipidemia.  Hypertension  This is a chronic problem. The problem is unchanged. The problem is uncontrolled. Associated symptoms include sweats. Pertinent negatives include no blurred  vision, chest pain, headaches, palpitations or shortness of breath. Past treatments include angiotensin blockers and diuretics. There is no history of kidney disease, CAD/MI or CVA.     Past Medical History:  Diagnosis Date  . Arthritis   . Diabetes mellitus without complication ([place])   . Hypertension   . Osteoporosis     Past Surgical History:  Procedure Laterality Date  . FOOT SURGERY Right 1999    Family History  Problem Relation Age of Onset  . Hypertension Mother   . Hypertension Father   . Diabetes Father   . Hypertension Sister   . Diabetes Sister   . Hypertension Brother     Social History   Socioeconomic History  . Marital status: Divorced    Spouse name: Not on file  . Number of children: 2  . Years of education: Not on file  . Highest education level: Master's degree (e.g., MA, MS, MEng, MEd, MSW, MBA)  Social Needs  . Financial resource strain: Not hard at all  . Food insecurity - worry: Never true  . Food insecurity - inability: Never true  . Transportation needs - medical: No  . Transportation needs - non-medical: No  Occupational History  . Occupation: Unemployed    Comment: Looking for work  Tobacco Use  . Smoking status: Never Smoker  . Smokeless tobacco: Never Used  . Tobacco comment: smoking cessation maerials not required  Substance and Sexual Activity  . Alcohol use: No    Alcohol/week: 0.0 oz  . Drug use: No  . Sexual activity: Not Currently    Birth control/protection: Post-menopausal  Other Topics Concern  . Not on file  Social History  Narrative  . Not on file     Current Outpatient Medications:  .  Calcium Carbonate-Vit D-Min (CALCIUM/VITAMIN D/MINERALS) 600-200 MG-UNIT TABS, Take 1 tablet by mouth daily., Disp: , Rfl:  .  glipiZIDE (GLUCOTROL) 5 MG tablet, Take 1 tablet (5 mg total) by mouth 2 (two) times daily before a meal., Disp: 180 tablet, Rfl: 3 .  losartan-hydrochlorothiazide (HYZAAR) 100-12.5 MG tablet, Take 1 tablet by  mouth daily., Disp: 90 tablet, Rfl: 3 .  meloxicam (MOBIC) 15 MG tablet, Take 1 tablet by mouth every other day. AS NEEDED, Disp: , Rfl:  .  metFORMIN (GLUCOPHAGE) 1000 MG tablet, Take 1 tablet (1,000 mg total) by mouth 2 (two) times daily with a meal., Disp: 180 tablet, Rfl: 3 .  pregabalin (LYRICA) 50 MG capsule, Take 1 capsule (50 mg total) by mouth 3 (three) times daily. (Patient not taking: Reported on 02/27/2017), Disp: 90 capsule, Rfl: 2 .  rosuvastatin (CRESTOR) 5 MG tablet, Take 1 tablet (5 mg total) by mouth at bedtime., Disp: 90 tablet, Rfl: 3 .  traZODone (DESYREL) 50 MG tablet, Take 0.5-1 tablets (25-50 mg total) by mouth at bedtime as needed for sleep., Disp: 90 tablet, Rfl: 2  Allergies  Allergen Reactions  . Other      Review of Systems  Constitutional: Negative for fatigue.  Eyes: Negative for blurred vision.  Respiratory: Negative for shortness of breath.   Cardiovascular: Negative for chest pain and palpitations.  Musculoskeletal: Negative for myalgias.  Neurological: Positive for dizziness. Negative for headaches.  Endo/Heme/Allergies: Negative for polydipsia.  Psychiatric/Behavioral: The patient is nervous/anxious.       Objective  Vitals:   02/27/17 1425  BP: (!) 145/80  Pulse: 78  Resp: 16  Temp: 98.4 F (36.9 C)  TempSrc: Oral  SpO2: 95%  Weight: 137 lb (62.1 kg)  Height: 5\' 3"  (1.6 m)    Physical Exam  Constitutional: She is oriented to person, place, and time and well-developed, well-nourished, and in no distress.  HENT:  Head: Normocephalic and atraumatic.  Cardiovascular: Normal rate, regular rhythm and normal heart sounds.  No murmur heard. Pulmonary/Chest: Effort normal and breath sounds normal. She has no wheezes.  Abdominal: Soft. Bowel sounds are normal. There is no tenderness.  Musculoskeletal: She exhibits no edema.  Neurological: She is alert and oriented to person, place, and time.  Psychiatric: Mood, memory, affect and judgment  normal.  Nursing note and vitals reviewed.    Recent Results (from the past 2160 hour(s))  POCT Glucose (CBG)     Status: Abnormal   Collection Time: 02/27/17  2:28 PM  Result Value Ref Range   POC Glucose 144 (A) 70 - 99 mg/dl     Assessment & Plan  1. Well controlled type 2 diabetes mellitus with peripheral neuropathy (HCC)   A1c 7.0%, well-controlled diabetes, because of some hypoglycemic morning episodes, we'll recommend decreasing glipizide to 1.5 tablets at night, obtain urine microalbumin/creatinine ratio, recheck in 3 month - POCT HgB A1C - POCT Glucose (CBG) - glipiZIDE (GLUCOTROL) 5 MG tablet; Take 1 tablet (5 mg total) by mouth 2 (two) times daily before a meal.  Dispense: 180 tablet; Refill: 3 - metFORMIN (GLUCOPHAGE) 1000 MG tablet; Take 1 tablet (1,000 mg total) by mouth 2 (two) times daily with a meal.  Dispense: 180 tablet; Refill: 3 - Urine Microalbumin w/creat. ratio  2. Mixed hyperlipidemia Obtain FLP, can adjust statin as opposed - rosuvastatin (CRESTOR) 5 MG tablet; Take 1 tablet (5 mg total) by  mouth at bedtime.  Dispense: 90 tablet; Refill: 3 - Lipid panel - COMPLETE METABOLIC PANEL WITH GFR  3. Essential hypertension DC losartan/HCTZ and start on Micardis-HCTZ with a higher dose of HCTZ to 25 mg 4 elevated blood pressure, recheck in 3 month - telmisartan-hydrochlorothiazide (MICARDIS HCT) 80-25 MG tablet; Take 1 tablet by mouth daily.  Dispense: 90 tablet; Refill: 0  4. Primary osteoarthritis of both knees She has arthritis of both knees and is being followed by orthopedics, has taken meloxicam in the past and is requesting refills. - meloxicam (MOBIC) 15 MG tablet; Take 1 tablet (15 mg total) by mouth every other day. AS NEEDED  Dispense: 90 tablet; Refill: 0  Mili Piltz [facility] 02/27/2017 2:35 PM

## 2017-02-27 NOTE — Progress Notes (Signed)
Subjective:   Brooke Randall is a 67 y.o. female who presents for Medicare Annual (Subsequent) preventive examination.  Review of Systems:  N/A Cardiac Risk Factors include: advanced age (>70men, >31 women);diabetes mellitus;dyslipidemia;hypertension;sedentary lifestyle     Objective:     Vitals: BP (!) 148/80 (BP Location: Right Arm, Patient Position: Sitting, Cuff Size: Normal)   Pulse 78   Temp 98.4 F (36.9 C) (Oral)   Resp 12   Ht 5\' 3"  (1.6 m)   Wt 137 lb 11.2 oz (62.5 kg)   BMI 24.39 kg/m   Body mass index is 24.39 kg/m.  Advanced Directives 02/27/2017 11/22/2016 07/24/2016 04/21/2016 03/31/2016 01/11/2016 08/24/2015  Does Patient Have a Medical Advance Directive? Yes No No No No No No  Type of Paramedic of [place];Living will - - - - - -  Does patient want to make changes to medical advance directive? Yes (MAU/Ambulatory/Procedural Areas - Information given) - - - - - -  Copy of [place] in Chart? No - copy requested - - - - - -  Would patient like information on creating a medical advance directive? - - - - - - No - patient declined information   Pt has been provided with the "MOST" and "DNR" documents for her review. Pt has been advised that she will only need to complete the document that is most appropriate to suit her health care wishes. Once completed, pt has been advised to return the appropriate document to the office for the physician to review and sign. Verbalized acceptance and understanding.  Tobacco Social History   Tobacco Use  Smoking Status Never Smoker  Smokeless Tobacco Never Used  Tobacco Comment   smoking cessation maerials not required     Counseling given: No Comment: smoking cessation maerials not required   Clinical Intake:  Pre-visit preparation completed: Yes  Pain : No/denies pain   BMI - recorded: 24.41 Nutritional Status: BMI of 19-24  Normal Has the patient had any N/V/D within the last  2 months?  No Does the patient have any non-healing wounds?  No Has the patient had any unintentional weight loss or weight gain?  No Is the patient diabetic?  Yes If diabetic, was a CBG obtained today?  No Did the patient bring in their glucometer from home?  No Comments: Does not check CBG at home.  How often do you need to have someone help you when you read instructions, pamphlets, or other written materials from your doctor or pharmacy?: 1 - Never  Interpreter Needed?: No  Information entered by :: Idell Pickles, LPN  Past Medical History:  Diagnosis Date  . Arthritis   . Diabetes mellitus without complication ([place])   . Hypertension   . Osteoporosis    Past Surgical History:  Procedure Laterality Date  . FOOT SURGERY Right 1999   Family History  Problem Relation Age of Onset  . Hypertension Mother   . Hypertension Father   . Diabetes Father   . Hypertension Sister   . Diabetes Sister   . Hypertension Brother    Social History   Socioeconomic History  . Marital status: Divorced    Spouse name: None  . Number of children: 2  . Years of education: None  . Highest education level: Master's degree (e.g., MA, MS, MEng, MEd, MSW, MBA)  Social Needs  . Financial resource strain: Not hard at all  . Food insecurity - worry: Never true  . Food insecurity -  inability: Never true  . Transportation needs - medical: No  . Transportation needs - non-medical: No  Occupational History  . Occupation: Unemployed    Comment: Looking for work  Tobacco Use  . Smoking status: Never Smoker  . Smokeless tobacco: Never Used  . Tobacco comment: smoking cessation maerials not required  Substance and Sexual Activity  . Alcohol use: No    Alcohol/week: 0.0 oz  . Drug use: No  . Sexual activity: Not Currently    Birth control/protection: Post-menopausal  Other Topics Concern  . None  Social History Narrative  . None    Outpatient Encounter Medications as of 02/27/2017  Medication  Sig  . Calcium Carbonate-Vit D-Min (CALCIUM/VITAMIN D/MINERALS) 600-200 MG-UNIT TABS Take 1 tablet by mouth daily.  Marland Kitchen glipiZIDE (GLUCOTROL) 5 MG tablet Take 1 tablet (5 mg total) by mouth 2 (two) times daily before a meal.  . losartan-hydrochlorothiazide (HYZAAR) 100-12.5 MG tablet Take 1 tablet by mouth daily.  . meloxicam (MOBIC) 15 MG tablet Take 1 tablet by mouth every other day. AS NEEDED  . metFORMIN (GLUCOPHAGE) 1000 MG tablet Take 1 tablet (1,000 mg total) by mouth 2 (two) times daily with a meal.  . rosuvastatin (CRESTOR) 5 MG tablet Take 1 tablet (5 mg total) by mouth at bedtime.  . traZODone (DESYREL) 50 MG tablet Take 0.5-1 tablets (25-50 mg total) by mouth at bedtime as needed for sleep.  . pregabalin (LYRICA) 50 MG capsule Take 1 capsule (50 mg total) by mouth 3 (three) times daily. (Patient not taking: Reported on 02/27/2017)   No facility-administered encounter medications on file as of 02/27/2017.     Activities of Daily Living In your present state of health, do you have any difficulty performing the following activities: 02/27/2017 01/08/2017  Hearing? N N  Comment denies use of hearing aids -  Vision? N Y  Comment readign glasses reading  Difficulty concentrating or making decisions? N N  Walking or climbing stairs? N N  Dressing or bathing? N N  Doing errands, shopping? N N  Preparing Food and eating ? N -  Comment denies use of dentures -  Using the Toilet? N -  In the past six months, have you accidently leaked urine? N -  Do you have problems with loss of bowel control? N -  Managing your Medications? N -  Managing your Finances? N -  Housekeeping or managing your Housekeeping? N -  Some recent data might be hidden    Patient Care Team: Roselee Nova, MD as PCP - General (Family Medicine) Troxler, Rodman Key, DPM as Consulting Physician (Podiatry) Earnestine Leys, MD as Consulting Physician (Orthopedic Surgery)    Assessment:   This is a routine wellness  examination for Brooke Randall.  Exercise Activities and Dietary recommendations Current Exercise Habits: The patient does not participate in regular exercise at present, Exercise limited by: None identified  Goals    . Exercise 150 min/wk Moderate Activity     Recommend to exercise for at least 150 minutes per week.        Fall Risk Fall Risk  02/27/2017 01/08/2017 04/21/2016 03/31/2016 01/11/2016  Falls in the past year? No No No No No   Is the patient's home free of loose throw rugs in walkways, pet beds, electrical cords, etc?   Yes Does the patient have any grab bars in the bathroom? No  Does the patient use a shower chair when bathing? No Does the patient have any stairs in or  around the home? Yes If so, are there any handrails?  Yes Does the patient have adequate lighting?  Yes Does the patient use a cane, walker or w/c? No Does the patient use of an elevated toilet seat? No  Timed Get Up and Go Performed: Yes. Pt ambulated 10 feet within 7 sec. Gait slow, steady and without the use of an assistive device. No intervention required at this time. Fall risk prevention has been discussed.  Depression Screen PHQ 2/9 Scores 02/27/2017 01/08/2017 04/21/2016 03/31/2016  PHQ - 2 Score 0 0 0 0     Cognitive Function     6CIT Screen 02/27/2017  What Year? 0 points  What month? 0 points  What time? 0 points  Count back from 20 0 points  Months in reverse 0 points  Repeat phrase 0 points  Total Score 0    Immunization History  Administered Date(s) Administered  . Influenza, High Dose Seasonal PF 11/22/2016  . Influenza-Unspecified 12/15/2014, 11/29/2015  . Pneumococcal Polysaccharide-23 03/31/2016    Qualifies for Shingles Vaccine? Yes. Due for Zostavax or Shingrix vaccine. Education has been provided regarding the importance of this vaccine. Pt has been advised to call her insurance company to determine her out of pocket expense. Advised she may also receive this vaccine at her local  pharmacy or Health Dept. Verbalized acceptance and understanding.  Due for Tdap vaccine. Declined my offer to administer today. Education has been provided regarding the importance of this vaccine but still declined. Pt has been advised to call her insurance company to determine her out of pocket expense. Advised she may also receive this vaccine at her local pharmacy or Health Dept. Verbalized acceptance and understanding.  Will be due for PCV13 on or after 03/31/17  Screening Tests Health Maintenance  Topic Date Due  . OPHTHALMOLOGY EXAM  02/15/2017  . TETANUS/TDAP  01/08/2018 (Originally 12/23/1969)  . PNA vac Low Risk Adult (2 of 2 - PCV13) 03/31/2017  . FOOT EXAM  04/21/2017  . MAMMOGRAM  05/03/2017  . HEMOGLOBIN A1C  05/23/2017  . Fecal DNA (Cologuard)  07/18/2019  . INFLUENZA VACCINE  Completed  . DEXA SCAN  Completed  . Hepatitis C Screening  Completed    Cancer Screenings: Lung: Low Dose CT Chest recommended if Age 62-80 years, 30 pack-year currently smoking OR have quit w/in 15years. Patient does not qualify. Breast:  Up to date on Mammogram? Yes. Completed mammogram on 05/03/16. Repeat every year. Up to date of Bone Density/Dexa? Yes. Completed 05/08/16. Osteoporotic screenings no longer required Colorectal: Cologuard completed 07/17/16. Repeat every 3 years  Overdue for diabetic eye exam. Last performed on 02/16/16. Pt has been advised about the importance in completing this exam. Advised to schedule an appt with [facility] to complete this exam. Once completed, reminded pt to provide the office with a copy of her diabetic eye exam. Verbalized acceptance and understanding.  Additional Screenings: Hepatitis B/HIV/Syphillis: does not qualify Hepatitis C Screening: Completed 03/31/16     Plan:  In preparation for this patient's Medicare Annual Wellness visit, I have personally reviewed the following information in the patient's chart:  A. Medical and Surgical  History B. Office visits, Hospitalizations and ER visits within the last 12 months C. Radiology, Laboratory and Pathological Reports (including those records in [facility]) D. Health Maintenance for accuracy and any attached, scanned or relevant reports or letters E.  Immunization History F.  Upcoming referrals and appointments G. Advance directives and Code Status  I  have personally addressed the Medicare Wellness Questionnaire with the patient and have noted and/or up dated the following information:  A.  Current medications (including OTC medications) B. Medication Allergies C. Medical and Surgical History D.  Physicians on the patient's care team [place] Physical activity G. Functional ability and status H. Nutritional status I. Risk for fall and preventative measures (including TUG test) J. Use of alcohol, tobacco or illicit drugs  K.          Screenings such as hearing, vision, cognitive and depression L. Advance Directives and Code Status M. Realistic Patient Goals N. Financial and Social strains, if any  In addition, I have reviewed and discussed with the patient, certain preventive protocols, quality metrics, and best practice recommendations. To ensure quality care and proper  continuity of care, any concerns that arose during the Medicare Wellness visit were addressed with the patient's physician. A written personalized care plan for preventive services as well as general preventive health recommendations were provided to patient.  Signed,  Aleatha Borer, LPN

## 2017-02-27 NOTE — Patient Instructions (Signed)
Brooke Randall , Thank you for taking time to come for your Medicare Wellness Visit. I appreciate your ongoing commitment to your health goals. Please review the following plan we discussed and let me know if I can assist you in the future.   Screening recommendations/referrals: Colorectal Screening: Cologuard completed 07/17/16. Repeat every 3 years Mammogram: Completed mammogram on 05/03/16. Repeat every year. Bone Density: Completed 05/08/16. Osteoporotic screenings no longer required Lung Cancer Screening: You do not qualify for this screening Hepatitis C Screening: Completed 03/31/16 HIV/Syphilis/Hepatitis B Screening: You do not qualify for this screening  Vision/Dental/Diabetic Exams: Diabetic Exams: Recommend annual diabetic eye exams for retinopathy and diabetic foot exams. Overdue for diabetic eye exam. Last performed on 02/16/16. Please schedule an appt with doctor to complete this exam. Once completed, please provide our office with a copy of your diabetic eye exam. Verbalized acceptance and understanding. Recommended yearly dental visit for hygiene and checkup  Vaccinations: Influenza vaccine: Completed 11/22/16 Pneumococcal vaccine: Completed PPSV23 03/31/16. Complete your PCV13 on or after 03/31/17 Tdap vaccine: Declined. Please call your insurance company to determine your out of pocket expense. You may also receive this vaccine at your local pharmacy or Health Dept. Shingles vaccine: Declined. Please call your insurance company to determine your out of pocket expense. You may also receive this vaccine at your local pharmacy or Health Dept.  Advanced directives: Please bring a copy of your POA (Power of Attorney) and/or Living Will to your next appointment.   Conditions/risks identified: Recommend to exercise for at least 150 minutes per week.  Next appointment: You are scheduled to see Dr. Manuella Ghazi on 02/27/17 @ 2:20pm.   Please schedule your Annual Wellness Visit with your Nurse Health  Advisor in one year.  Preventive Care 67 Years and Older, Female Preventive care refers to lifestyle choices and visits with your health care provider that can promote health and wellness. What does preventive care include?  A yearly physical exam. This is also called an annual well check.  Dental exams once or twice a year.  Routine eye exams. Ask your health care provider how often you should have your eyes checked.  Personal lifestyle choices, including:  Daily care of your teeth and gums.  Regular physical activity.  Eating a healthy diet.  Avoiding tobacco and drug use.  Limiting alcohol use.  Practicing safe sex.  Taking low-dose aspirin every day.  Taking vitamin and mineral supplements as recommended by your health care provider. What happens during an annual well check? The services and screenings done by your health care provider during your annual well check will depend on your age, overall health, lifestyle risk factors, and family history of disease. Counseling  Your health care provider may ask you questions about your:  Alcohol use.  Tobacco use.  Drug use.  Emotional well-being.  Home and relationship well-being.  Sexual activity.  Eating habits.  History of falls.  Memory and ability to understand (cognition).  Work and work Statistician.  Reproductive health. Screening  You may have the following tests or measurements:  Height, weight, and BMI.  Blood pressure.  Lipid and cholesterol levels. These may be checked every 5 years, or more frequently if you are over 60 years old.  Skin check.  Lung cancer screening. You may have this screening every year starting at age 62 if you have a 30-pack-year history of smoking and currently smoke or have quit within the past 15 years.  Fecal occult blood test (FOBT) of the stool.  You may have this test every year starting at age 46.  Flexible sigmoidoscopy or colonoscopy. You may have a  sigmoidoscopy every 5 years or a colonoscopy every 10 years starting at age 61.  Hepatitis C blood test.  Hepatitis B blood test.  Sexually transmitted disease (STD) testing.  Diabetes screening. This is done by checking your blood sugar (glucose) after you have not eaten for a while (fasting). You may have this done every 1-3 years.  Bone density scan. This is done to screen for osteoporosis. You may have this done starting at age 66.  Mammogram. This may be done every 1-2 years. Talk to your health care provider about how often you should have regular mammograms. Talk with your health care provider about your test results, treatment options, and if necessary, the need for more tests. Vaccines  Your health care provider may recommend certain vaccines, such as:  Influenza vaccine. This is recommended every year.  Tetanus, diphtheria, and acellular pertussis (Tdap, Td) vaccine. You may need a Td booster every 10 years.  Zoster vaccine. You may need this after age 65.  Pneumococcal 13-valent conjugate (PCV13) vaccine. One dose is recommended after age 23.  Pneumococcal polysaccharide (PPSV23) vaccine. One dose is recommended after age 34. Talk to your health care provider about which screenings and vaccines you need and how often you need them. This information is not intended to replace advice given to you by your health care provider. Make sure you discuss any questions you have with your health care provider. Document Released: 02/26/2015 Document Revised: 10/20/2015 Document Reviewed: 12/01/2014 Elsevier Interactive Patient Education  2017 [place] Prevention in the Home Falls can cause injuries. They can happen to people of all ages. There are many things you can do to make your home safe and to help prevent falls. What can I do on the outside of my home?  Regularly fix the edges of walkways and driveways and fix any cracks.  Remove anything that might make you  trip as you walk through a door, such as a raised step or threshold.  Trim any bushes or trees on the path to your home.  Use bright outdoor lighting.  Clear any walking paths of anything that might make someone trip, such as rocks or tools.  Regularly check to see if handrails are loose or broken. Make sure that both sides of any steps have handrails.  Any raised decks and porches should have guardrails on the edges.  Have any leaves, snow, or ice cleared regularly.  Use sand or salt on walking paths during winter.  Clean up any spills in your garage right away. This includes oil or grease spills. What can I do in the bathroom?  Use night lights.  Install grab bars by the toilet and in the tub and shower. Do not use towel bars as grab bars.  Use non-skid mats or decals in the tub or shower.  If you need to sit down in the shower, use a plastic, non-slip stool.  Keep the floor dry. Clean up any water that spills on the floor as soon as it happens.  Remove soap buildup in the tub or shower regularly.  Attach bath mats securely with double-sided non-slip rug tape.  Do not have throw rugs and other things on the floor that can make you trip. What can I do in the bedroom?  Use night lights.  Make sure that you have a light by your bed that  is easy to reach.  Do not use any sheets or blankets that are too big for your bed. They should not hang down onto the floor.  Have a firm chair that has side arms. You can use this for support while you get dressed.  Do not have throw rugs and other things on the floor that can make you trip. What can I do in the kitchen?  Clean up any spills right away.  Avoid walking on wet floors.  Keep items that you use a lot in easy-to-reach places.  If you need to reach something above you, use a strong step stool that has a grab bar.  Keep electrical cords out of the way.  Do not use floor polish or wax that makes floors slippery. If  you must use wax, use non-skid floor wax.  Do not have throw rugs and other things on the floor that can make you trip. What can I do with my stairs?  Do not leave any items on the stairs.  Make sure that there are handrails on both sides of the stairs and use them. Fix handrails that are broken or loose. Make sure that handrails are as long as the stairways.  Check any carpeting to make sure that it is firmly attached to the stairs. Fix any carpet that is loose or worn.  Avoid having throw rugs at the top or bottom of the stairs. If you do have throw rugs, attach them to the floor with carpet tape.  Make sure that you have a light switch at the top of the stairs and the bottom of the stairs. If you do not have them, ask someone to add them for you. What else can I do to help prevent falls?  Wear shoes that:  Do not have high heels.  Have rubber bottoms.  Are comfortable and fit you well.  Are closed at the toe. Do not wear sandals.  If you use a stepladder:  Make sure that it is fully opened. Do not climb a closed stepladder.  Make sure that both sides of the stepladder are locked into place.  Ask someone to hold it for you, if possible.  Clearly mark and make sure that you can see:  Any grab bars or handrails.  First and last steps.  Where the edge of each step is.  Use tools that help you move around (mobility aids) if they are needed. These include:  Canes.  Walkers.  Scooters.  Crutches.  Turn on the lights when you go into a dark area. Replace any light bulbs as soon as they burn out.  Set up your furniture so you have a clear path. Avoid moving your furniture around.  If any of your floors are uneven, fix them.  If there are any pets around you, be aware of where they are.  Review your medicines with your doctor. Some medicines can make you feel dizzy. This can increase your chance of falling. Ask your doctor what other things that you can do to  help prevent falls. This information is not intended to replace advice given to you by your health care provider. Make sure you discuss any questions you have with your health care provider. Document Released: 11/26/2008 Document Revised: 07/08/2015 Document Reviewed: 03/06/2014 Elsevier Interactive Patient Education  2017 [employer].

## 2017-02-28 ENCOUNTER — Ambulatory Visit: Payer: Medicare HMO | Admitting: Family Medicine

## 2017-03-15 DIAGNOSIS — R69 Illness, unspecified: Secondary | ICD-10-CM | POA: Diagnosis not present

## 2017-03-20 DIAGNOSIS — E113213 Type 2 diabetes mellitus with mild nonproliferative diabetic retinopathy with macular edema, bilateral: Secondary | ICD-10-CM | POA: Diagnosis not present

## 2017-03-20 LAB — HM DIABETES EYE EXAM

## 2017-03-22 DIAGNOSIS — M7542 Impingement syndrome of left shoulder: Secondary | ICD-10-CM | POA: Insufficient documentation

## 2017-03-23 DIAGNOSIS — R69 Illness, unspecified: Secondary | ICD-10-CM | POA: Diagnosis not present

## 2017-03-27 DIAGNOSIS — R69 Illness, unspecified: Secondary | ICD-10-CM | POA: Diagnosis not present

## 2017-04-03 DIAGNOSIS — R69 Illness, unspecified: Secondary | ICD-10-CM | POA: Diagnosis not present

## 2017-04-17 DIAGNOSIS — R69 Illness, unspecified: Secondary | ICD-10-CM | POA: Diagnosis not present

## 2017-05-04 DIAGNOSIS — M7542 Impingement syndrome of left shoulder: Secondary | ICD-10-CM | POA: Diagnosis not present

## 2017-05-04 DIAGNOSIS — M7712 Lateral epicondylitis, left elbow: Secondary | ICD-10-CM | POA: Diagnosis not present

## 2017-05-07 ENCOUNTER — Ambulatory Visit (INDEPENDENT_AMBULATORY_CARE_PROVIDER_SITE_OTHER): Payer: Medicare HMO | Admitting: Nurse Practitioner

## 2017-05-07 ENCOUNTER — Encounter: Payer: Self-pay | Admitting: Nurse Practitioner

## 2017-05-07 VITALS — BP 136/88 | HR 74 | Temp 98.0°F | Resp 14 | Ht 62.0 in | Wt 136.0 lb

## 2017-05-07 DIAGNOSIS — M791 Myalgia, unspecified site: Secondary | ICD-10-CM | POA: Diagnosis not present

## 2017-05-07 DIAGNOSIS — F329 Major depressive disorder, single episode, unspecified: Secondary | ICD-10-CM | POA: Diagnosis not present

## 2017-05-07 DIAGNOSIS — R69 Illness, unspecified: Secondary | ICD-10-CM | POA: Diagnosis not present

## 2017-05-07 DIAGNOSIS — Z5181 Encounter for therapeutic drug level monitoring: Secondary | ICD-10-CM

## 2017-05-07 DIAGNOSIS — Z1231 Encounter for screening mammogram for malignant neoplasm of breast: Secondary | ICD-10-CM | POA: Diagnosis not present

## 2017-05-07 DIAGNOSIS — R5383 Other fatigue: Secondary | ICD-10-CM

## 2017-05-07 DIAGNOSIS — Z1239 Encounter for other screening for malignant neoplasm of breast: Secondary | ICD-10-CM

## 2017-05-07 DIAGNOSIS — F32A Depression, unspecified: Secondary | ICD-10-CM

## 2017-05-07 DIAGNOSIS — F419 Anxiety disorder, unspecified: Secondary | ICD-10-CM | POA: Diagnosis not present

## 2017-05-07 DIAGNOSIS — E782 Mixed hyperlipidemia: Secondary | ICD-10-CM | POA: Diagnosis not present

## 2017-05-07 DIAGNOSIS — E785 Hyperlipidemia, unspecified: Secondary | ICD-10-CM | POA: Diagnosis not present

## 2017-05-07 NOTE — Progress Notes (Addendum)
Name: Brooke Randall   MRN: [id number]    DOB: 1951-02-07   Date:05/07/2017       Progress Note  Subjective  Chief Complaint  Chief Complaint  Patient presents with  . Fatigue    with body aches and muscle weakness onset 1 month    HPI Fatigue Patient was seen by Dr. Manuella Ghazi in January and didn't have any issues, she states shortly after she had left shoulder pain and fatigue after overworking it at the gym. She got a shot of steroids with [facility] the first week of February which helped with pain but not fatigue. She notes she is fatigued, and c/o generalized weakness every since then. Patient sts shoulder pain returned last week and she received a prednisone course. Patient states about the same time she got a pneumovax shot over 2 months ago. Patient states fatigue, generalized weakness, and body aches have been relatively persistent over the past 2 months. Patient sts she enjoys working out and it is frustrating that she is limited. Patient states she started walking recently but states she is very tired after she does it- positive post-exertional malaise. She states she has never been a good sleeper; states 5-6 hours a night is normal for her- unrefreshing sleep at baseline. Patient endorses increase in anxiety; states she is not an anxious person but notes with age she has increased anxiety hours before social events that she used to enjoy before. Denies orthostatic-related symptoms. Positive history of anemia when she was younger and of child-bearing age.  Denies recent infections- no URI, UTI or GI symptoms. Patient states she lives alone and her family lives outside of the state and country and feels she would like someone to talk to. Stopped taking crestor due to muscle cramps last year.    Patient Active Problem List   Diagnosis Date Noted  . Mass of right hand 04/21/2016  . Screening for colon cancer 04/21/2016  . Post-menopausal 03/31/2016  . Acute midline low back pain with  right-sided sciatica 01/11/2016  . Well woman exam without gynecological exam 05/04/2015  . Hyperlipidemia 12/17/2014  . Well controlled type 2 diabetes mellitus with peripheral neuropathy ([place]) 12/17/2014  . Hypertension 08/20/2014  . Vitamin D deficiency 08/20/2014    Social History   Tobacco Use  . Smoking status: Never Smoker  . Smokeless tobacco: Never Used  . Tobacco comment: smoking cessation maerials not required  Substance Use Topics  . Alcohol use: No    Alcohol/week: 0.0 oz     Current Outpatient Medications:  .  Calcium Carbonate-Vit D-Min (CALCIUM/VITAMIN D/MINERALS) 600-200 MG-UNIT TABS, Take 1 tablet by mouth daily., Disp: , Rfl:  .  glipiZIDE (GLUCOTROL) 5 MG tablet, Take 1 tablet (5 mg total) by mouth 2 (two) times daily before a meal., Disp: 180 tablet, Rfl: 3 .  meloxicam (MOBIC) 15 MG tablet, Take 1 tablet (15 mg total) by mouth every other day. AS NEEDED, Disp: 90 tablet, Rfl: 0 .  metFORMIN (GLUCOPHAGE) 1000 MG tablet, Take 1 tablet (1,000 mg total) by mouth 2 (two) times daily with a meal., Disp: 180 tablet, Rfl: 3 .  traZODone (DESYREL) 50 MG tablet, Take 0.5-1 tablets (25-50 mg total) by mouth at bedtime as needed for sleep., Disp: 90 tablet, Rfl: 2 .  telmisartan-hydrochlorothiazide (MICARDIS HCT) 80-25 MG tablet, Take 1 tablet by mouth daily., Disp: 90 tablet, Rfl: 0  Allergies  Allergen Reactions  . Other     ROS  Constitutional: Negative for fever or weight  change.  Respiratory: Negative for cough and shortness of breath.   Cardiovascular: Negative for chest pain or palpitations.  Gastrointestinal: Negative for abdominal pain, no bowel changes.  GU: Negative dysuria, no other urinary symptoms, or vaginal bleeding or discharge Musculoskeletal: Negative for gait problem or joint swelling.  Skin: Negative for rash.  Neurological: Negative for dizziness or headache.  No other specific complaints in a complete review of systems (except as listed in  HPI above).  Objective  Vitals:   05/07/17 1118  BP: 136/88  Pulse: 74  Resp: 14  Temp: 98 F (36.7 C)  TempSrc: Oral  SpO2: 95%  Weight: 136 lb (61.7 kg)  Height: 5' 2" (1.575 m)    Body mass index is 24.87 kg/m.  Nursing Note and Vital Signs reviewed.  Physical Exam  Constitutional: Patient appears well-developed and well-nourished. No distress.  HEENT: head atraumatic, normocephalic, pupils equal and reactive to light,  no maxillary or rontal sinus tenderness , neck supple without lymphadenopathy, oropharynx pink and moist without exudate, no carotid bruits Cardiovascular: Normal rate, regular rhythm, S1/S2 present.  No murmur or rub heard. No BLE edema. Pulmonary/Chest: Effort normal and breath sounds clear. No respiratory distress or retractions.  Abdominal: Soft and non-tender, bowel sounds present  No CVA Tenderness  Neuro: alert and oriented, sensation intact bilaterally, equal strong upper and lower extremity strength Psychiatric: Patient has a normal mood and affect. behavior is normal. Judgment and thought content normal.  Diabetic Foot Exam - Simple   Simple Foot Form Diabetic Foot exam was performed with the following findings:  Yes 05/07/2017 12:16 PM  Visual Inspection Sensation Testing Pulse Check Comments    Depression screen [facility] 2/9 05/07/2017 05/07/2017 02/27/2017 01/08/2017 04/21/2016  Decreased Interest 1 0 0 0 0  Down, Depressed, Hopeless 2 0 0 0 0  PHQ - 2 Score 3 0 0 0 0  Altered sleeping 2 - - - -  Tired, decreased energy 3 - - - -  Change in appetite 1 - - - -  Feeling bad or failure about yourself  0 - - - -  Trouble concentrating 1 - - - -  Moving slowly or fidgety/restless 0 - - - -  Suicidal thoughts 0 - - - -  PHQ-9 Score 10 - - - -  Difficult doing work/chores Somewhat difficult - - - -   GAD 7 : Generalized Anxiety Score 05/07/2017  Nervous, Anxious, on Edge 2  Control/stop worrying 2  Worry too much - different things 2  Trouble  relaxing 2  Restless 1  Easily annoyed or irritable 1  Afraid - awful might happen 2  Total GAD 7 Score 12     No results found for this or any previous visit (from the past 72 hour(s)).  Assessment & Plan 1. Other fatigue - discussed hydration, sleep, will follow up in 1 week  - CBC - CK (Creatine Kinase) - Vitamin D (25 hydroxy) - TSH - Sed Rate (ESR) - C-reactive protein - COMPLETE METABOLIC PANEL WITH GFR  2. Myalgia - Increase water intake, discussed warning signs - CBC - CK (Creatine Kinase) - Sed Rate (ESR) - C-reactive protein - COMPLETE METABOLIC PANEL WITH GFR  3. Medication monitoring encounter - CK (Creatine Kinase) - Magnesium - COMPLETE METABOLIC PANEL WITH GFR  4. Screening breast examination  - MM Digital Screening; Future  5. Depression, unspecified depression type - Referred to counselor; follow up with PCP in 1 week  - CBC -  TSH - COMPLETE METABOLIC PANEL WITH GFR - Lipid Profile  6. Anxiety  - Referred to counselor; follow up with PCP in 1 week  - TSH  7. Mixed hyperlipidemia - Discussed diet and exercise - Lipid Profile  -Red flags and when to present for emergency care or RTC including fever >101.38F, chest pain, shortness of breath, new/worsening/un-resolving symptoms,  reviewed with patient at time of visit. Follow up and care instructions discussed and provided in AVS. -Reviewed Health Maintenance: mammogram ordered, foot exam completed ------------------------------------ Web site containing a list of providers shared with pt by NP, patient will call I have reviewed this encounter including the documentation in this note and/or discussed this patient with the provider, Suezanne Cheshire DNP AGNP-C. I am certifying that I agree with the content of this note as supervising physician. Enid Derry, [facility] 05/07/2017, 4:53 PM

## 2017-05-07 NOTE — Patient Instructions (Addendum)
It was a pleasure meeting you today. We are going to do a lot of blood here today to check for underlying causes of your symptoms. Please follow-up with a counselor as soon as possible- [email or web address] and put in your zipcode and any other information you would like to include to find the right specialist for you. Please drink lots of water, try to rest well at night and eat healthy.   If you have severe pain, if you have very dark color-colored urine, chest pain or shob please go to the ER.   Fatigue Fatigue is feeling tired all of the time, a lack of energy, or a lack of motivation. Occasional or mild fatigue is often a normal response to activity or life in general. However, long-lasting (chronic) or extreme fatigue may indicate an underlying medical condition. Follow these instructions at home: Watch your fatigue for any changes. The following actions may help to lessen any discomfort you are feeling:  Talk to your health care provider about how much sleep you need each night. Try to get the required amount every night.  Take medicines only as directed by your health care provider.  Eat a healthy and nutritious diet. Ask your health care provider if you need help changing your diet.  Drink enough fluid to keep your urine clear or pale yellow.  Practice ways of relaxing, such as yoga, meditation, massage therapy, or acupuncture.  Exercise regularly.  Change situations that cause you stress. Try to keep your work and personal routine reasonable.  Do not abuse illegal drugs.  Limit alcohol intake to no more than 1 drink per day for nonpregnant women and 2 drinks per day for men. One drink equals 12 ounces of beer, 5 ounces of wine, or 1 ounces of hard liquor.  Take a multivitamin, if directed by your health care provider.  Contact a health care provider if:  Your fatigue does not get better.  You have a fever.  You have unintentional weight loss or gain.  You have  headaches.  You have difficulty: ? Falling asleep. ? Sleeping throughout the night.  You feel angry, guilty, anxious, or sad.  You are unable to have a bowel movement (constipation).  You skin is dry.  Your legs or another part of your body is swollen. Get help right away if:  You feel confused.  Your vision is blurry.  You feel faint or pass out.  You have a severe headache.  You have severe abdominal, pelvic, or back pain.  You have chest pain, shortness of breath, or an irregular or fast heartbeat.  You are unable to urinate or you urinate less than normal.  You develop abnormal bleeding, such as bleeding from the rectum, vagina, nose, lungs, or nipples.  You vomit blood.  You have thoughts about harming yourself or committing suicide.  You are worried that you might harm someone else. This information is not intended to replace advice given to you by your health care provider. Make sure you discuss any questions you have with your health care provider. Document Released: 11/27/2006 Document Revised: 07/08/2015 Document Reviewed: 06/03/2013 Elsevier Interactive Patient Education  [employer].

## 2017-05-08 ENCOUNTER — Telehealth: Payer: Self-pay

## 2017-05-08 LAB — COMPLETE METABOLIC PANEL WITH GFR
AG Ratio: 1.9 (calc) (ref 1.0–2.5)
ALT: 13 U/L (ref 6–29)
AST: 13 U/L (ref 10–35)
Albumin: 4.3 g/dL (ref 3.6–5.1)
Alkaline phosphatase (APISO): 59 U/L (ref 33–130)
BUN: 18 mg/dL (ref 7–25)
CALCIUM: 10.1 mg/dL (ref 8.6–10.4)
CO2: 28 mmol/L (ref 20–32)
CREATININE: 0.79 mg/dL (ref 0.50–0.99)
Chloride: 103 mmol/L (ref 98–110)
GFR, EST AFRICAN AMERICAN: 90 mL/min/{1.73_m2} (ref >=60)
GFR, EST NON AFRICAN AMERICAN: 78 mL/min/{1.73_m2} (ref >=60)
GLUCOSE: 132 mg/dL (ref 65–139)
Globulin: 2.3 g/dL (calc) (ref 1.9–3.7)
Potassium: 3.9 mmol/L (ref 3.5–5.3)
Sodium: 137 mmol/L (ref 135–146)
TOTAL PROTEIN: 6.6 g/dL (ref 6.1–8.1)
Total Bilirubin: 0.7 mg/dL (ref 0.2–1.2)

## 2017-05-08 LAB — CBC
HCT: 38.6 % (ref 35.0–45.0)
HEMOGLOBIN: 13.3 g/dL (ref 11.7–15.5)
MCH: 29.3 pg (ref 27.0–33.0)
MCHC: 34.5 g/dL (ref 32.0–36.0)
MCV: 85 fL (ref 80.0–100.0)
MPV: 10.8 fL (ref 7.5–12.5)
Platelets: 324 10*3/uL (ref 140–400)
RBC: 4.54 10*6/uL (ref 3.80–5.10)
RDW: 12.9 % (ref 11.0–15.0)
WBC: 7.7 10*3/uL (ref 3.8–10.8)

## 2017-05-08 LAB — VITAMIN D 25 HYDROXY (VIT D DEFICIENCY, FRACTURES): Vit D, 25-Hydroxy: 51 ng/mL (ref 30–100)

## 2017-05-08 LAB — MAGNESIUM: MAGNESIUM: 2.2 mg/dL (ref 1.5–2.5)

## 2017-05-08 LAB — LIPID PANEL
Cholesterol: 218 mg/dL — ABNORMAL HIGH (ref <200)
HDL: 49 mg/dL — AB (ref >50)
LDL Cholesterol (Calc): 135 mg/dL (calc) — ABNORMAL HIGH
NON-HDL CHOLESTEROL (CALC): 169 mg/dL — AB (ref <130)
Total CHOL/HDL Ratio: 4.4 (calc) (ref <5.0)
Triglycerides: 199 mg/dL — ABNORMAL HIGH (ref <150)

## 2017-05-08 LAB — SEDIMENTATION RATE: SED RATE: 9 mm/h (ref 0–30)

## 2017-05-08 LAB — TSH: TSH: 2.59 mIU/L (ref 0.40–4.50)

## 2017-05-08 LAB — C-REACTIVE PROTEIN: CRP: 2.1 mg/L (ref <8.0)

## 2017-05-08 LAB — CK: Total CK: 55 U/L (ref 29–143)

## 2017-05-08 NOTE — Telephone Encounter (Signed)
Pt notified of her results and verbalized understanding.

## 2017-05-08 NOTE — Telephone Encounter (Signed)
-----   Message from Fredderick Severance, NP sent at 05/08/2017  8:39 AM EDT ----- Please call patient and let her know that all her labs looked good except her lipids were mildly elevated- likely because she stopped her crestor. We will discuss it in more detail at her follow-up.

## 2017-05-14 ENCOUNTER — Encounter: Payer: Self-pay | Admitting: Nurse Practitioner

## 2017-05-14 ENCOUNTER — Ambulatory Visit (INDEPENDENT_AMBULATORY_CARE_PROVIDER_SITE_OTHER): Payer: Medicare HMO | Admitting: Nurse Practitioner

## 2017-05-14 VITALS — BP 136/76 | HR 64 | Temp 98.1°F | Resp 16 | Ht 62.0 in | Wt 137.5 lb

## 2017-05-14 DIAGNOSIS — L282 Other prurigo: Secondary | ICD-10-CM

## 2017-05-14 DIAGNOSIS — M25512 Pain in left shoulder: Secondary | ICD-10-CM

## 2017-05-14 DIAGNOSIS — F321 Major depressive disorder, single episode, moderate: Secondary | ICD-10-CM | POA: Diagnosis not present

## 2017-05-14 DIAGNOSIS — E782 Mixed hyperlipidemia: Secondary | ICD-10-CM | POA: Diagnosis not present

## 2017-05-14 DIAGNOSIS — R5383 Other fatigue: Secondary | ICD-10-CM

## 2017-05-14 DIAGNOSIS — E1142 Type 2 diabetes mellitus with diabetic polyneuropathy: Secondary | ICD-10-CM | POA: Diagnosis not present

## 2017-05-14 DIAGNOSIS — I10 Essential (primary) hypertension: Secondary | ICD-10-CM

## 2017-05-14 DIAGNOSIS — R69 Illness, unspecified: Secondary | ICD-10-CM | POA: Diagnosis not present

## 2017-05-14 MED ORDER — TIZANIDINE HCL 2 MG PO CAPS: 2.0000 mg | ORAL_CAPSULE | Freq: Two times a day (BID) | ORAL | 0 refills | Status: DC

## 2017-05-14 MED ORDER — ESCITALOPRAM OXALATE 10 MG PO TABS: 10.0000 mg | ORAL_TABLET | Freq: Every day | ORAL | 0 refills | Status: DC

## 2017-05-14 NOTE — Progress Notes (Addendum)
Name: Brooke Randall   MRN: [id number]    DOB: 03-10-1950   Date:05/14/2017       Progress Note  Subjective  Chief Complaint  Chief Complaint  Patient presents with  . Follow-up    1 week recheck    HPI  Patient presents for one week follow-up of fatigue, depression and anxiety   Patient was unable to find counselor- states used mychart and only found a few options that were in [place] but she was hoping to find someone that might have a similar cultural background to her. Patient did not want to redo her PHQ9 and GAD7- states note much has changed Depression screen [facility] 2/9 05/07/2017  Decreased Interest 1  Down, Depressed, Hopeless 2  PHQ - 2 Score 3  Altered sleeping 2  Tired, decreased energy 3  Change in appetite 1  Feeling bad or failure about yourself  0  Trouble concentrating 1  Moving slowly or fidgety/restless 0  Suicidal thoughts 0  PHQ-9 Score 10  Difficult doing work/chores Somewhat difficult    GAD 7 : Generalized Anxiety Score 05/07/2017  Nervous, Anxious, on Edge 2  Control/stop worrying 2  Worry too much - different things 2  Trouble relaxing 2  Restless 1  Easily annoyed or irritable 1  Afraid - awful might happen 2  Total GAD 7 Score 12    Fatigue has improved, but still present- just not as severe and not as often. She notes that it is still present. Patient states she drinks 3-4 glasses of water a day. States her shoulder pain has worsened- states it is very painful. States is starting physical therapy on Thursday at [facility]. Patient endorses dizziness when laying down and leans to left side when eyes are closed. States is reproducible in this same position- not when standing or turning to the right side. She states she had been dx with vertigo and resolved with inner ear crystal therapy.   Patient endorses 6 months of mid left back itching sts noticed the other week she has a hyperpigmented area that came up in the itchy spot and itching is not  constant. Pt denies bug bites. Has tried switching to a sports bra without relief of symptoms.   Discussed lab work: Lipids: does not want medication; but diet control   Patient Active Problem List   Diagnosis Date Noted  . Mass of right hand 04/21/2016  . Screening for colon cancer 04/21/2016  . Post-menopausal 03/31/2016  . Acute midline low back pain with right-sided sciatica 01/11/2016  . Well woman exam without gynecological exam 05/04/2015  . Hyperlipidemia 12/17/2014  . Well controlled type 2 diabetes mellitus with peripheral neuropathy ([place]) 12/17/2014  . Hypertension 08/20/2014  . Vitamin D deficiency 08/20/2014    Social History   Tobacco Use  . Smoking status: Never Smoker  . Smokeless tobacco: Never Used  . Tobacco comment: smoking cessation maerials not required  Substance Use Topics  . Alcohol use: No    Alcohol/week: 0.0 oz     Current Outpatient Medications:  .  Calcium Carbonate-Vit D-Min (CALCIUM/VITAMIN D/MINERALS) 600-200 MG-UNIT TABS, Take 1 tablet by mouth daily., Disp: , Rfl:  .  glipiZIDE (GLUCOTROL) 5 MG tablet, Take 1 tablet (5 mg total) by mouth 2 (two) times daily before a meal., Disp: 180 tablet, Rfl: 3 .  meloxicam (MOBIC) 15 MG tablet, Take 1 tablet (15 mg total) by mouth every other day. AS NEEDED, Disp: 90 tablet, Rfl: 0 .  metFORMIN (  GLUCOPHAGE) 1000 MG tablet, Take 1 tablet (1,000 mg total) by mouth 2 (two) times daily with a meal., Disp: 180 tablet, Rfl: 3 .  telmisartan-hydrochlorothiazide (MICARDIS HCT) 80-25 MG tablet, Take 1 tablet by mouth daily., Disp: 90 tablet, Rfl: 0 .  traZODone (DESYREL) 50 MG tablet, Take 0.5-1 tablets (25-50 mg total) by mouth at bedtime as needed for sleep., Disp: 90 tablet, Rfl: 2  Allergies  Allergen Reactions  . Other     ROS  No other specific complaints in a complete review of systems (except as listed in HPI above).  Objective  Vitals:   05/14/17 1122  BP: 136/76  Pulse: 64  Resp: 16  Temp:  98.1 F (36.7 C)  TempSrc: Oral  SpO2: 96%  Weight: 137 lb 8 oz (62.4 kg)  Height: 5\' 2"  (1.575 m)     Body mass index is 25.15 kg/m.  Nursing Note and Vital Signs reviewed.  Physical Exam  Skin:        Constitutional: Patient appears well-developed and well-nourished.  No distress.  Cardiovascular: Normal rate, regular rhythm, S1/S2 present.  No murmur or rub heard.  Pulmonary/Chest: Effort normal and breath sounds clear. No respiratory distress or retractions. Abdominal: Soft and non-tender, bowel sounds present  Psychiatric: Patient has a normal mood and affect. behavior is normal. Judgment and thought content normal.  No results found for this or any previous visit (from the past 72 hour(s)).  Assessment & Plan  1. Acute pain of left shoulder - PT follow up with [facility]  - tizanidine (ZANAFLEX) 2 MG capsule; Take 1 capsule (2 mg total) by mouth 2 (two) times daily.  Dispense: 30 capsule; Refill: 0  2. Current moderate episode of major depressive disorder without prior episode (HCC) - counseling  - escitalopram (LEXAPRO) 10 MG tablet; Take 1 tablet (10 mg total) by mouth daily.  Dispense: 60 tablet; Refill: 0  3. Pruritic rash - Ambulatory referral to Dermatology  4. Essential hypertension Stable   5. Well controlled type 2 diabetes mellitus with peripheral neuropathy ([place]) - please check sugars when episodes of fatigue - discussed diet and exercise   6. Other fatigue - counseling - hydration - symptoms improving   7. Mixed hyperlipidemia - Discussed diet  -Red flags and when to present for emergency care or RTC including fever >101.68F, chest pain, shortness of breath, new/worsening/un-resolving symptoms, reviewed with patient at time of visit. Follow up and care instructions discussed and provided in AVS.   -------------------------- I have reviewed this encounter including the documentation in this note and/or discussed this patient with the  provider, Suezanne Cheshire DNP AGNP-C. I am certifying that I agree with the content of this note as supervising physician. Enid Derry, [facility] 05/14/2017, 2:59 PM

## 2017-05-14 NOTE — Patient Instructions (Addendum)
Website: [email or web address] enter zipcode, click on medicare and any other filters that you want.   I want you to drink at least 8 glasses of water daily.  ESCITALOPRAM - take 1/2 dose (5mg ) every day for one week then take 1 pill every day afterwards. First time you take it take it in the morning; if you have side effects you can switch to every night.  Fat and Cholesterol Restricted Diet Getting too much fat and cholesterol in your diet may cause health problems. Following this diet helps keep your fat and cholesterol at normal levels. This can keep you from getting sick. What types of fat should I choose?  Choose monosaturated and polyunsaturated fats. These are found in foods such as olive oil, canola oil, flaxseeds, walnuts, almonds, and seeds.  Eat more omega-3 fats. Good choices include salmon, mackerel, sardines, tuna, flaxseed oil, and ground flaxseeds.  Limit saturated fats. These are in animal products such as meats, butter, and cream. They can also be in plant products such as palm oil, palm kernel oil, and coconut oil.  Avoid foods with partially hydrogenated oils in them. These contain trans fats. Examples of foods that have trans fats are stick margarine, some tub margarines, cookies, crackers, and other baked goods. What general guidelines do I need to follow?  Check food labels. Look for the words "trans fat" and "saturated fat."  When preparing a meal: ? Fill half of your plate with vegetables and green salads. ? Fill one fourth of your plate with whole grains. Look for the word "whole" as the first word in the ingredient list. ? Fill one fourth of your plate with lean protein foods.  Eat more foods that have fiber, like apples, carrots, beans, peas, and barley.  Eat more home-cooked foods. Eat less at restaurants and buffets.  Limit or avoid alcohol.  Limit foods high in starch and sugar.  Limit fried foods.  Cook foods without frying them. Baking, boiling,  grilling, and broiling are all great options.  Lose weight if you are overweight. Losing even a small amount of weight can help your overall health. It can also help prevent diseases such as diabetes and heart disease. What foods can I eat? Grains Whole grains, such as whole wheat or whole grain breads, crackers, cereals, and pasta. Unsweetened oatmeal, bulgur, barley, quinoa, or brown rice. Corn or whole wheat flour tortillas. Vegetables Fresh or frozen vegetables (raw, steamed, roasted, or grilled). Green salads. Fruits All fresh, canned (in natural juice), or frozen fruits. Meat and Other Protein Products Ground beef (85% or leaner), grass-fed beef, or beef trimmed of fat. Skinless chicken or [place]. Ground chicken or [place]. Pork trimmed of fat. All fish and seafood. Eggs. Dried beans, peas, or lentils. Unsalted nuts or seeds. Unsalted canned or dry beans. Dairy Low-fat dairy products, such as skim or 1% milk, 2% or reduced-fat cheeses, low-fat ricotta or cottage cheese, or plain low-fat yogurt. Fats and Oils Tub margarines without trans fats. Light or reduced-fat mayonnaise and salad dressings. Avocado. Olive, canola, sesame, or safflower oils. Natural peanut or almond butter (choose ones without added sugar and oil). The items listed above may not be a complete list of recommended foods or beverages. Contact your dietitian for more options. What foods are not recommended? Grains White bread. White pasta. White rice. Cornbread. Bagels, pastries, and croissants. Crackers that contain trans fat. Vegetables White potatoes. Corn. Creamed or fried vegetables. Vegetables in a cheese sauce. Fruits Dried fruits. Canned fruit in light  or heavy syrup. Fruit juice. Meat and Other Protein Products Fatty cuts of meat. Ribs, chicken wings, bacon, sausage, bologna, salami, chitterlings, fatback, hot dogs, bratwurst, and packaged luncheon meats. Liver and organ meats. Dairy Whole or 2% milk, cream,  half-and-half, and cream cheese. Whole milk cheeses. Whole-fat or sweetened yogurt. Full-fat cheeses. Nondairy creamers and whipped toppings. Processed cheese, cheese spreads, or cheese curds. Sweets and Desserts Corn syrup, sugars, honey, and molasses. Candy. Jam and jelly. Syrup. Sweetened cereals. Cookies, pies, cakes, donuts, muffins, and ice cream. Fats and Oils Butter, stick margarine, lard, shortening, ghee, or bacon fat. Coconut, palm kernel, or palm oils. Beverages Alcohol. Sweetened drinks (such as sodas, lemonade, and fruit drinks or punches). The items listed above may not be a complete list of foods and beverages to avoid. Contact your dietitian for more information. This information is not intended to replace advice given to you by your health care provider. Make sure you discuss any questions you have with your health care provider. Document Released: 08/01/2011 Document Revised: 10/07/2015 Document Reviewed: 05/01/2013 Elsevier Interactive Patient Education  2018 [employer].   Escitalopram  What is this medicine? ESCITALOPRAM (es sye TAL oh pram) is used to treat depression and certain types of anxiety. This medicine may be used for other purposes; ask your health care provider or pharmacist if you have questions. COMMON BRAND NAME(S): Lexapro What should I tell my health care provider before I take this medicine? They need to know if you have any of these conditions: -bipolar disorder or a family history of bipolar disorder -diabetes -glaucoma -heart disease -kidney or liver disease -receiving electroconvulsive therapy -seizures (convulsions) -suicidal thoughts, plans, or attempt by you or a family member -an unusual or allergic reaction to escitalopram, citalopram, other medicines, foods, dyes, or preservatives -pregnant or trying to become pregnant -breast-feeding How should I use this medicine? Take this medicine by mouth. Follow the directions on the  prescription label. Use a specially marked spoon or container to measure your medicine. Ask your pharmacist if you do not have one. Household spoons are not accurate. This medicine can be taken with or without food. Take your medicine at regular intervals. Do not take it more often than directed. Do not stop taking this medicine suddenly except upon the advice of your doctor. Stopping this medicine too quickly may cause serious side effects or your condition may worsen. A special MedGuide will be given to you by the pharmacist with each prescription and refill. Be sure to read this information carefully each time. Talk to your pediatrician regarding the use of this medicine in children. Special care may be needed. Overdosage: If you think you have taken too much of this medicine contact a poison control center or emergency room at once. NOTE: This medicine is only for you. Do not share this medicine with others. What if I miss a dose? If you miss a dose, take it as soon as you can. If it is almost time for your next dose, take only that dose. Do not take double or extra doses. What may interact with this medicine? Do not take this medicine with any of the following medications: -certain medicines for fungal infections like fluconazole, itraconazole, ketoconazole, posaconazole, voriconazole -cisapride -citalopram -dofetilide -dronedarone -linezolid -MAOIs like Carbex, Eldepryl, Marplan, Nardil, and Parnate -methylene blue (injected into a vein) -pimozide -thioridazine -ziprasidone This medicine may also interact with the following medications: -alcohol -amphetamines -aspirin and aspirin-like medicines -carbamazepine -certain medicines for depression, anxiety, or psychotic disturbances -  certain medicines for migraine headache like almotriptan, eletriptan, frovatriptan, naratriptan, rizatriptan, sumatriptan, zolmitriptan -certain medicines for sleep -certain medicines that treat or prevent  blood clots like warfarin, enoxaparin, and dalteparin -cimetidine -diuretics -fentanyl -furazolidone -isoniazid -lithium -metoprolol -NSAIDs, medicines for pain and inflammation, like ibuprofen or naproxen -other medicines that prolong the QT interval (cause an abnormal heart rhythm) -procarbazine -rasagiline -supplements like St. John's wort, kava kava, valerian -tramadol -tryptophan This list may not describe all possible interactions. Give your health care provider a list of all the medicines, herbs, non-prescription drugs, or dietary supplements you use. Also tell them if you smoke, drink alcohol, or use illegal drugs. Some items may interact with your medicine. What should I watch for while using this medicine? Tell your doctor if your symptoms do not get better or if they get worse. Visit your doctor or health care professional for regular checks on your progress. Because it may take several weeks to see the full effects of this medicine, it is important to continue your treatment as prescribed by your doctor. Patients and their families should watch out for new or worsening thoughts of suicide or depression. Also watch out for sudden changes in feelings such as feeling anxious, agitated, panicky, irritable, hostile, aggressive, impulsive, severely restless, overly excited and hyperactive, or not being able to sleep. If this happens, especially at the beginning of treatment or after a change in dose, call your health care professional. Dennis Bast may get drowsy or dizzy. Do not drive, use machinery, or do anything that needs mental alertness until you know how this medicine affects you. Do not stand or sit up quickly, especially if you are an older patient. This reduces the risk of dizzy or fainting spells. Alcohol may interfere with the effect of this medicine. Avoid alcoholic drinks. Your mouth may get dry. Chewing sugarless gum or sucking hard candy, and drinking plenty of water may help.  Contact your doctor if the problem does not go away or is severe. What side effects may I notice from receiving this medicine? Side effects that you should report to your doctor or health care professional as soon as possible: -allergic reactions like skin rash, itching or hives, swelling of the face, lips, or tongue -anxious -black, tarry stools -changes in vision -confusion -elevated mood, decreased need for sleep, racing thoughts, impulsive behavior -eye pain -fast, irregular heartbeat -feeling faint or lightheaded, falls -feeling agitated, angry, or irritable -hallucination, loss of contact with reality -loss of balance or coordination -loss of memory -restlessness, pacing, inability to keep still -seizures -stiff muscles -suicidal thoughts or other mood changes -trouble sleeping -unusual bleeding or bruising -unusually weak or tired -vomiting Side effects that usually do not require medical attention (report to your doctor or health care professional if they continue or are bothersome): -changes in appetite -change in sex drive or performance -headache -increased sweating -indigestion, nausea -tremors Ths list may not describe all possible side effects. Call your doctor for medical advice about side effects. You may report side effects to FDA at 1-800-FDA-1088. This list may not describe all possible side effects. Call your doctor for medical advice about side effects. You may report side effects to FDA at 1-800-FDA-1088. Where should I keep my medicine? Keep out of reach of children. Store at room temperature between 15 and 30 degrees C (59 and 86 degrees F). Throw away any unused medicine after the expiration date. NOTE: This sheet is a summary. It may not cover all possible information. If you have  questions about this medicine, talk to your doctor, pharmacist, or health care provider.  2018 Elsevier/Gold Standard (2015-07-03 15:26:08)

## 2017-05-16 ENCOUNTER — Telehealth: Payer: Self-pay | Admitting: Family Medicine

## 2017-05-16 NOTE — Telephone Encounter (Signed)
Copied from [place] [phone]. Topic: Quick Communication - See Telephone Encounter >> May 16, 2017  5:03 PM Arletha Grippe wrote: CRM for notification. See Telephone encounter for: 05/16/17. Pt called and stated that there is an interaction with one of the medications that was prescribed on Monday.  Pt does not know which mediation.  She says [facility] sent a fax on Monday. Please contact [facility]/ patient to clarify  [place], [address] [phone] (Phone)

## 2017-05-17 DIAGNOSIS — M25512 Pain in left shoulder: Secondary | ICD-10-CM | POA: Diagnosis not present

## 2017-05-17 DIAGNOSIS — M25612 Stiffness of left shoulder, not elsewhere classified: Secondary | ICD-10-CM | POA: Diagnosis not present

## 2017-05-17 NOTE — Telephone Encounter (Signed)
Order faxed over, [place] sent clarification

## 2017-05-22 DIAGNOSIS — M25612 Stiffness of left shoulder, not elsewhere classified: Secondary | ICD-10-CM | POA: Diagnosis not present

## 2017-05-22 DIAGNOSIS — R609 Edema, unspecified: Secondary | ICD-10-CM | POA: Diagnosis not present

## 2017-05-22 DIAGNOSIS — M25512 Pain in left shoulder: Secondary | ICD-10-CM | POA: Diagnosis not present

## 2017-05-24 DIAGNOSIS — M25512 Pain in left shoulder: Secondary | ICD-10-CM | POA: Diagnosis not present

## 2017-05-24 DIAGNOSIS — M25612 Stiffness of left shoulder, not elsewhere classified: Secondary | ICD-10-CM | POA: Diagnosis not present

## 2017-05-24 DIAGNOSIS — R609 Edema, unspecified: Secondary | ICD-10-CM | POA: Diagnosis not present

## 2017-05-28 ENCOUNTER — Ambulatory Visit
Admission: RE | Admit: 2017-05-28 | Discharge: 2017-05-28 | Disposition: A | Discharge status: Home / Self Care | Payer: Medicare HMO | Source: Ambulatory Visit | Attending: Nurse Practitioner | Admitting: Nurse Practitioner

## 2017-05-28 DIAGNOSIS — Z1231 Encounter for screening mammogram for malignant neoplasm of breast: Secondary | ICD-10-CM | POA: Diagnosis not present

## 2017-05-28 DIAGNOSIS — R928 Other abnormal and inconclusive findings on diagnostic imaging of breast: Secondary | ICD-10-CM | POA: Diagnosis not present

## 2017-05-28 DIAGNOSIS — Z1239 Encounter for other screening for malignant neoplasm of breast: Secondary | ICD-10-CM

## 2017-05-29 DIAGNOSIS — R609 Edema, unspecified: Secondary | ICD-10-CM | POA: Diagnosis not present

## 2017-05-29 DIAGNOSIS — M25512 Pain in left shoulder: Secondary | ICD-10-CM | POA: Diagnosis not present

## 2017-05-29 DIAGNOSIS — M25612 Stiffness of left shoulder, not elsewhere classified: Secondary | ICD-10-CM | POA: Diagnosis not present

## 2017-05-30 ENCOUNTER — Other Ambulatory Visit: Payer: Self-pay | Admitting: Nurse Practitioner

## 2017-05-30 DIAGNOSIS — N6489 Other specified disorders of breast: Secondary | ICD-10-CM

## 2017-05-30 DIAGNOSIS — R928 Other abnormal and inconclusive findings on diagnostic imaging of breast: Secondary | ICD-10-CM

## 2017-06-05 ENCOUNTER — Encounter: Payer: Self-pay | Admitting: Family Medicine

## 2017-06-05 ENCOUNTER — Ambulatory Visit (INDEPENDENT_AMBULATORY_CARE_PROVIDER_SITE_OTHER): Payer: Medicare HMO | Admitting: Family Medicine

## 2017-06-05 VITALS — BP 132/74 | HR 73 | Temp 97.9°F | Resp 16 | Ht 62.0 in | Wt 138.3 lb

## 2017-06-05 DIAGNOSIS — E11319 Type 2 diabetes mellitus with unspecified diabetic retinopathy without macular edema: Secondary | ICD-10-CM | POA: Insufficient documentation

## 2017-06-05 DIAGNOSIS — F5104 Psychophysiologic insomnia: Secondary | ICD-10-CM | POA: Diagnosis not present

## 2017-06-05 DIAGNOSIS — M47819 Spondylosis without myelopathy or radiculopathy, site unspecified: Secondary | ICD-10-CM | POA: Diagnosis not present

## 2017-06-05 DIAGNOSIS — R69 Illness, unspecified: Secondary | ICD-10-CM | POA: Diagnosis not present

## 2017-06-05 DIAGNOSIS — I10 Essential (primary) hypertension: Secondary | ICD-10-CM

## 2017-06-05 DIAGNOSIS — E1169 Type 2 diabetes mellitus with other specified complication: Secondary | ICD-10-CM

## 2017-06-05 DIAGNOSIS — F320 Major depressive disorder, single episode, mild: Secondary | ICD-10-CM

## 2017-06-05 DIAGNOSIS — M7542 Impingement syndrome of left shoulder: Secondary | ICD-10-CM | POA: Diagnosis not present

## 2017-06-05 DIAGNOSIS — E785 Hyperlipidemia, unspecified: Secondary | ICD-10-CM

## 2017-06-05 DIAGNOSIS — E1142 Type 2 diabetes mellitus with diabetic polyneuropathy: Secondary | ICD-10-CM

## 2017-06-05 DIAGNOSIS — H02401 Unspecified ptosis of right eyelid: Secondary | ICD-10-CM | POA: Diagnosis not present

## 2017-06-05 DIAGNOSIS — G4700 Insomnia, unspecified: Secondary | ICD-10-CM | POA: Diagnosis not present

## 2017-06-05 DIAGNOSIS — F5101 Primary insomnia: Secondary | ICD-10-CM

## 2017-06-05 DIAGNOSIS — Z79899 Other long term (current) drug therapy: Secondary | ICD-10-CM | POA: Diagnosis not present

## 2017-06-05 DIAGNOSIS — M1710 Unilateral primary osteoarthritis, unspecified knee: Secondary | ICD-10-CM | POA: Insufficient documentation

## 2017-06-05 DIAGNOSIS — M179 Osteoarthritis of knee, unspecified: Secondary | ICD-10-CM | POA: Insufficient documentation

## 2017-06-05 LAB — POCT UA - MICROALBUMIN: Microalbumin Ur, POC: NEGATIVE mg/L

## 2017-06-05 LAB — POCT GLYCOSYLATED HEMOGLOBIN (HGB A1C): Hemoglobin A1C: 6.8

## 2017-06-05 LAB — VITAMIN B12: Vitamin B-12: 502 pg/mL (ref 200–1100)

## 2017-06-05 MED ORDER — METFORMIN HCL ER 750 MG PO TB24: 1500.0000 mg | ORAL_TABLET | Freq: Every day | ORAL | 0 refills | Status: DC

## 2017-06-05 MED ORDER — EZETIMIBE 10 MG PO TABS: 10.0000 mg | ORAL_TABLET | Freq: Every day | ORAL | 3 refills | Status: DC

## 2017-06-05 MED ORDER — TEMAZEPAM 15 MG PO CAPS: 15.0000 mg | ORAL_CAPSULE | Freq: Every evening | ORAL | 1 refills | Status: DC | PRN

## 2017-06-05 MED ORDER — TRAZODONE HCL 50 MG PO TABS: 25.0000 mg | ORAL_TABLET | Freq: Every evening | ORAL | 1 refills | Status: DC | PRN

## 2017-06-05 MED ORDER — GABAPENTIN 100 MG PO CAPS: 100.0000 mg | ORAL_CAPSULE | Freq: Every day | ORAL | 0 refills | Status: DC

## 2017-06-05 MED ORDER — TELMISARTAN-HCTZ 80-25 MG PO TABS: 1.0000 | ORAL_TABLET | Freq: Every day | ORAL | 0 refills | Status: DC

## 2017-06-05 MED ORDER — DULOXETINE HCL 30 MG PO CPEP: 30.0000 mg | ORAL_CAPSULE | Freq: Every day | ORAL | 0 refills | Status: DC

## 2017-06-05 MED ORDER — GLIPIZIDE ER 5 MG PO TB24: 5.0000 mg | ORAL_TABLET | Freq: Every day | ORAL | 0 refills | Status: DC

## 2017-06-05 NOTE — Progress Notes (Signed)
Name: Brooke Randall   MRN: [id number]    DOB: 1950-06-28   Date:06/05/2017       Progress Note  Subjective  Chief Complaint  Chief Complaint  Patient presents with  . Medication Refill  . Depression    Has not started medication due to not wanting to take it with the muscle relaxer at the same time.  . Diabetes    Needs refill on medication-takes BC twice weekly. Low-139 Average-150-170 Highest-190  . Hypertension    Denies any symptoms  . Shoulder Pain    Onset- 4 months, left shoulder pain.    HPI  Major Depression: retired in 2014, however children moved out of town, family is in [place] and she feels lonely and overwhelmed having to make all the decisions. She did not start Citalopram because of cost but wiling to try Duloxetine to help with pain. Explained that we will need to stop Trazodone because of possible drug and drug interaction She is muslim and feels isolated.   HTN: taking medications, denies side effects, no chest pain or palpitation.   DMII: check urine micro today, on ARB, she has retinopathy, neuropathy and dyslipidemia. She is not on statin therapy because of muscle pain, willing to try Zetia. Not on gabapentin because it did not help with symptoms in the past, explained that we can increase dose to make it work. We will try adding Duloxetine. She denies polyphagia, polydipsia or polyuria.   Impingement syndrome: seeing Dr. Mack Guise and Dr. Sabra Heck, had injection but still has pain. Pain is worse at night, radiates from neck to proximal arm, steroid injection helped temporarily only  Ptosis right eye lid: intermittent, throughout the day, right eyelid feels heavy, no blurred vision or weakness  Patient Active Problem List   Diagnosis Date Noted  . Arthropathy of spinal facet joint 06/05/2017  . Osteoarthritis of knee 06/05/2017  . Current moderate episode of major depressive disorder without prior episode ([place]) 05/14/2017  . Impingement syndrome of left shoulder  region 03/22/2017  . Mass of right hand 04/21/2016  . Post-menopausal 03/31/2016  . Hyperlipidemia 12/17/2014  . Well controlled type 2 diabetes mellitus with peripheral neuropathy ([place]) 12/17/2014  . Hypertension 08/20/2014  . Vitamin D deficiency 08/20/2014  . Lumbar disc herniation with radiculopathy 09/12/2012    Past Surgical History:  Procedure Laterality Date  . FOOT SURGERY Right 1999    Family History  Problem Relation Age of Onset  . Hypertension Mother   . Hypertension Father   . Diabetes Father   . Hypertension Sister   . Diabetes Sister   . Hypertension Brother     Social History   Socioeconomic History  . Marital status: Divorced    Spouse name: Not on file  . Number of children: 2  . Years of education: Not on file  . Highest education level: Master's degree (e.g., MA, MS, MEng, MEd, MSW, MBA)  Occupational History  . Occupation: Unemployed    Comment: Looking for work  Social Needs  . Financial resource strain: Not hard at all  . Food insecurity:    Worry: Never true    Inability: Never true  . Transportation needs:    Medical: No    Non-medical: No  Tobacco Use  . Smoking status: Never Smoker  . Smokeless tobacco: Never Used  . Tobacco comment: smoking cessation maerials not required  Substance and Sexual Activity  . Alcohol use: No    Alcohol/week: 0.0 oz  . Drug use: No  .  Sexual activity: Not Currently    Birth control/protection: Post-menopausal  Lifestyle  . Physical activity:    Days per week: 0 days    Minutes per session: 0 min  . Stress: Not at all  Relationships  . Social connections:    Talks on phone: Patient refused    Gets together: Patient refused    Attends religious service: Patient refused    Active member of club or organization: Patient refused    Attends meetings of clubs or organizations: Patient refused    Relationship status: Divorced  . Intimate partner violence:    Fear of current or ex partner: No     Emotionally abused: No    Physically abused: No    Forced sexual activity: No  Other Topics Concern  . Not on file  Social History Narrative  . Not on file     Current Outpatient Medications:  .  Calcium Carbonate-Vit D-Min (CALCIUM/VITAMIN D/MINERALS) 600-200 MG-UNIT TABS, Take 1 tablet by mouth daily., Disp: , Rfl:  .  telmisartan-hydrochlorothiazide (MICARDIS HCT) 80-25 MG tablet, Take 1 tablet by mouth daily., Disp: 90 tablet, Rfl: 0 .  tizanidine (ZANAFLEX) 2 MG capsule, Take 1 capsule (2 mg total) by mouth 2 (two) times daily., Disp: 30 capsule, Rfl: 0 .  traZODone (DESYREL) 50 MG tablet, Take 0.5-1 tablets (25-50 mg total) by mouth at bedtime as needed for sleep., Disp: 90 tablet, Rfl: 1 .  escitalopram (LEXAPRO) 10 MG tablet, Take 1 tablet (10 mg total) by mouth daily. (Patient not taking: Reported on 06/05/2017), Disp: 60 tablet, Rfl: 0 .  ezetimibe (ZETIA) 10 MG tablet, Take 1 tablet (10 mg total) by mouth daily., Disp: 90 tablet, Rfl: 3 .  gabapentin (NEURONTIN) 100 MG capsule, Take 1-3 capsules (100-300 mg total) by mouth at bedtime., Disp: 90 capsule, Rfl: 0 .  glipiZIDE (GLUCOTROL XL) 5 MG 24 hr tablet, Take 1 tablet (5 mg total) by mouth daily with breakfast., Disp: 90 tablet, Rfl: 0 .  metFORMIN (GLUCOPHAGE-XR) 750 MG 24 hr tablet, Take 2 tablets (1,500 mg total) by mouth daily with breakfast., Disp: 180 tablet, Rfl: 0  Allergies  Allergen Reactions  . Statins     Muscle aches     ROS  Constitutional: Negative for fever or weight change.  Respiratory: Negative for cough and shortness of breath.   Cardiovascular: Negative for chest pain or palpitations.  Gastrointestinal: Negative for abdominal pain, no bowel changes.  Musculoskeletal: Negative for gait problem or joint swelling.  Skin: Negative for rash.  Neurological: Negative for dizziness or headache.  No other specific complaints in a complete review of systems (except as listed in HPI  above).   Objective  Vitals:   06/05/17 1335  BP: 132/74  Pulse: 73  Resp: 16  Temp: 97.9 F (36.6 C)  TempSrc: Oral  SpO2: 96%  Weight: 138 lb 4.8 oz (62.7 kg)  Height: 5' 2" (1.575 m)    Body mass index is 25.3 kg/m.  Physical Exam  Constitutional: Patient appears well-developed and well-nourished. No distress.  HEENT: head atraumatic, normocephalic, pupils equal and reactive to light, ptosis not noticed today, normal grip, cranial nerves intactneck supple, throat within normal limits Cardiovascular: Normal rate, regular rhythm and normal heart sounds.  No murmur heard. No BLE edema. Pulmonary/Chest: Effort normal and breath sounds normal. No respiratory distress. Abdominal: Soft.  There is no tenderness. Psychiatric: Patient has a normal mood and affect. behavior is normal. Judgment and thought content normal. Muscular skeletal:  positive impingement sign on the left   Recent Results (from the past 2160 hour(s))  HM DIABETES EYE EXAM     Status: Abnormal   Collection Time: 03/20/17 12:00 AM  Result Value Ref Range   HM Diabetic Eye Exam Retinopathy (A) No Retinopathy    Comment: background diabetic retinopathy; no treatment needed  CBC     Status: None   Collection Time: 05/07/17 12:25 PM  Result Value Ref Range   WBC 7.7 3.8 - 10.8 Thousand/uL   RBC 4.54 3.80 - 5.10 Million/uL   Hemoglobin 13.3 11.7 - 15.5 g/dL   HCT 38.6 35.0 - 45.0 %   MCV 85.0 80.0 - 100.0 fL   MCH 29.3 27.0 - 33.0 pg   MCHC 34.5 32.0 - 36.0 g/dL   RDW 12.9 11.0 - 15.0 %   Platelets 324 140 - 400 Thousand/uL   MPV 10.8 7.5 - 12.5 fL  CK (Creatine Kinase)     Status: None   Collection Time: 05/07/17 12:25 PM  Result Value Ref Range   Total CK 55 29 - 143 U/L  Vitamin D (25 hydroxy)     Status: None   Collection Time: 05/07/17 12:25 PM  Result Value Ref Range   Vit D, 25-Hydroxy 51 30 - 100 ng/mL    Comment: Vitamin D Status         25-OH Vitamin D: . Deficiency:                    <20  ng/mL Insufficiency:             20 - 29 ng/mL Optimal:                 > or = 30 ng/mL . For 25-OH Vitamin D testing on patients on  D2-supplementation and patients for whom quantitation  of D2 and D3 fractions is required, the QuestAssureD(TM) 25-OH VIT D, (D2,D3), LC/MS/MS is recommended: order  code [id number] (patients >61yr). . For more information on this test, go to: http://education.questdiagnostics.com/faq/FAQ163 (This link is being provided for  informational/educational purposes only.)   TSH     Status: None   Collection Time: 05/07/17 12:25 PM  Result Value Ref Range   TSH 2.59 0.40 - 4.50 mIU/L  Sed Rate (ESR)     Status: None   Collection Time: 05/07/17 12:25 PM  Result Value Ref Range   Sed Rate 9 0 - 30 mm/h  C-reactive protein     Status: None   Collection Time: 05/07/17 12:25 PM  Result Value Ref Range   CRP 2.1 <8.0 mg/L  Magnesium     Status: None   Collection Time: 05/07/17 12:25 PM  Result Value Ref Range   Magnesium 2.2 1.5 - 2.5 mg/dL  Lipid panel     Status: Abnormal   Collection Time: 05/07/17 12:25 PM  Result Value Ref Range   Cholesterol 218 (H) <200 mg/dL   HDL 49 (L) >50 mg/dL   Triglycerides 199 (H) <150 mg/dL   LDL Cholesterol (Calc) 135 (H) mg/dL (calc)    Comment: Reference range: <100 . Desirable range <100 mg/dL for primary prevention;   <70 mg/dL for patients with CHD or diabetic patients  with > or = 2 CHD risk factors. .Marland KitchenLDL-C is now calculated using the Martin-Hopkins  calculation, which is a validated novel method providing  better accuracy than the Friedewald equation in the  estimation of LDL-C.  MCresenciano Genreet al. JAnnamaria Helling 28550;158(68:  2061-2068  (http://education.[facility].com/faq/FAQ164)    Total CHOL/HDL Ratio 4.4 <5.0 (calc)   Non-HDL Cholesterol (Calc) 169 (H) <130 mg/dL (calc)    Comment: For patients with diabetes plus 1 major ASCVD risk  factor, treating to a non-HDL-C goal of <100 mg/dL  (LDL-C of <70 mg/dL) is  considered a therapeutic  option.   COMPLETE METABOLIC PANEL WITH GFR     Status: None   Collection Time: 05/07/17 12:25 PM  Result Value Ref Range   Glucose, Bld 132 65 - 139 mg/dL    Comment: .        Non-fasting reference interval .    BUN 18 7 - 25 mg/dL   Creat 0.79 0.50 - 0.99 mg/dL    Comment: For patients >28 years of age, the reference limit for Creatinine is approximately 13% higher for people identified as African-American. .    GFR, Est Non African American 78 > OR = 60 mL/min/1.17m   GFR, Est African American 90 > OR = 60 mL/min/1.746m  BUN/Creatinine Ratio NOT APPLICABLE 6 - 22 (calc)   Sodium 137 135 - 146 mmol/L   Potassium 3.9 3.5 - 5.3 mmol/L   Chloride 103 98 - 110 mmol/L   CO2 28 20 - 32 mmol/L   Calcium 10.1 8.6 - 10.4 mg/dL   Total Protein 6.6 6.1 - 8.1 g/dL   Albumin 4.3 3.6 - 5.1 g/dL   Globulin 2.3 1.9 - 3.7 g/dL (calc)   AG Ratio 1.9 1.0 - 2.5 (calc)   Total Bilirubin 0.7 0.2 - 1.2 mg/dL   Alkaline phosphatase (APISO) 59 33 - 130 U/L   AST 13 10 - 35 U/L   ALT 13 6 - 29 U/L  POCT HgB A1C     Status: None   Collection Time: 06/05/17  2:13 PM  Result Value Ref Range   Hemoglobin A1C 6.8       PHQ2/9: [facility] 05/07/2017 05/07/2017 02/27/2017 01/08/2017 04/21/2016  Decreased Interest 1 0 0 0 0  Down, Depressed, Hopeless 2 0 0 0 0  PHQ - 2 Score 3 0 0 0 0  Altered sleeping 2 - - - -  Tired, decreased energy 3 - - - -  Change in appetite 1 - - - -  Feeling bad or failure about yourself  0 - - - -  Trouble concentrating 1 - - - -  Moving slowly or fidgety/restless 0 - - - -  Suicidal thoughts 0 - - - -  PHQ-9 Score 10 - - - -  Difficult doing work/chores Somewhat difficult - - - -     Fall Risk: Fall Risk  05/07/2017 02/27/2017 01/08/2017 04/21/2016 03/31/2016  Falls in the past year? Yes No No No No  Number falls in past yr: 1 - - - -  Injury with Fall? No - - - -     Assessment & Plan  1. Well controlled type 2  diabetes mellitus with peripheral neuropathy (HCC)  We will change from Metformin 1000 mg BID to ER metformin 1500 mg, stop glipizide 10 mg at night to 5 mg ER prior to breakfast or lunch to avoid hypoglycemia.  - POCT HgB A1C - POCT UA - Microalbumin - gabapentin (NEURONTIN) 100 MG capsule; Take 1-3 capsules (100-300 mg total) by mouth at bedtime.  Dispense: 90 capsule; Refill: 0 - glipiZIDE (GLUCOTROL XL) 5 MG 24 hr tablet; Take 1 tablet (5 mg total) by mouth daily with breakfast.  Dispense:  90 tablet; Refill: 0  2. Dyslipidemia associated with type 2 diabetes mellitus ([place])  Intolerant to statin therapy  - ezetimibe (ZETIA) 10 MG tablet; Take 1 tablet (10 mg total) by mouth daily.  Dispense: 90 tablet; Refill: 3 - metFORMIN (GLUCOPHAGE-XR) 750 MG 24 hr tablet; Take 2 tablets (1,500 mg total) by mouth daily with breakfast.  Dispense: 180 tablet; Refill: 0 - glipiZIDE (GLUCOTROL XL) 5 MG 24 hr tablet; Take 1 tablet (5 mg total) by mouth daily with breakfast.  Dispense: 90 tablet; Refill: 0  3. Essential hypertension  At goal  - telmisartan-hydrochlorothiazide (MICARDIS HCT) 80-25 MG tablet; Take 1 tablet by mouth daily.  Dispense: 90 tablet; Refill: 0  4. Insomnia, unspecified type  Change in therapy, stop Trazodone start Temazepam, discussed possible risk   5. Impingement syndrome of left shoulder region  Seeing Ortho   6. Arthropathy of spinal facet joint  Seeing Ortho  7. Long-term use of high-risk medication  - Vitamin B12  8. Diabetic retinopathy associated with controlled type 2 diabetes mellitus ([place])  Continue diabetes control   9. Ptosis of eyelid, right  Discussed referral to neurologist, also may be from excess skin, she wants to hold off on referral for now  10. Depression, major, single episode, mild (HCC)  - DULoxetine (CYMBALTA) 30 MG capsule; Take 1-2 capsules (30-60 mg total) by mouth daily. 30 mg for the first week after that stay on 60 mg  Dispense:  60 capsule; Refill: 0   11. Psychophysiological insomnia  - temazepam (RESTORIL) 15 MG capsule; Take 1 capsule (15 mg total) by mouth at bedtime as needed for sleep.  Dispense: 30 capsule; Refill: 1

## 2017-06-08 ENCOUNTER — Other Ambulatory Visit: Payer: Medicare HMO

## 2017-06-08 ENCOUNTER — Encounter: Payer: Self-pay | Admitting: Family Medicine

## 2017-06-08 ENCOUNTER — Ambulatory Visit: Payer: Medicare HMO

## 2017-06-08 ENCOUNTER — Other Ambulatory Visit: Payer: Self-pay | Admitting: Family Medicine

## 2017-06-08 NOTE — Telephone Encounter (Signed)
Copied from [place] [phone]. Topic: Quick Communication - See Telephone Encounter >> Jun 08, 2017  8:37 AM Brooke Randall wrote: CRM for notification.   Pt. Prescription  gabapentin (NEURONTIN) 100 MG capsule  - [facility] charging a lot for the 100mg . Asking if can get a prescription for 300mg .  It is cheaper  Pt. Asking a call back from nurse today.  She is leaving for a month on Sunday and needs before she leaves.   [facility] [address], [place] - [place] [address] Phone: [phone] Fax: [phone]   See Telephone encounter for: 06/08/17.

## 2017-06-08 NOTE — Telephone Encounter (Signed)
Pt called to check on status of rx for gabapentin   PT is requesting a call back  From office

## 2017-06-09 NOTE — Telephone Encounter (Signed)
We just re-started her on gabapentin 100 mg to titrate up slowly to a max of 300 mg three times daily, she should not be on that dose yet

## 2017-06-11 ENCOUNTER — Other Ambulatory Visit: Payer: Self-pay | Admitting: Family Medicine

## 2017-06-11 DIAGNOSIS — E1142 Type 2 diabetes mellitus with diabetic polyneuropathy: Secondary | ICD-10-CM

## 2017-06-11 MED ORDER — GABAPENTIN 300 MG PO CAPS: 300.0000 mg | ORAL_CAPSULE | Freq: Every day | ORAL | 0 refills | Status: DC

## 2017-06-11 NOTE — Telephone Encounter (Signed)
Patient notified we are unable to change prescription of Gabapentin until her dosage is titrated up to 300 mg.

## 2017-06-12 ENCOUNTER — Ambulatory Visit: Payer: Medicare HMO | Admitting: Family Medicine

## 2017-07-23 ENCOUNTER — Other Ambulatory Visit: Payer: Self-pay | Admitting: Family Medicine

## 2017-07-23 ENCOUNTER — Ambulatory Visit: Payer: Medicare HMO | Admitting: Family Medicine

## 2017-07-23 DIAGNOSIS — E1169 Type 2 diabetes mellitus with other specified complication: Secondary | ICD-10-CM

## 2017-07-23 DIAGNOSIS — M7542 Impingement syndrome of left shoulder: Secondary | ICD-10-CM | POA: Diagnosis not present

## 2017-07-23 DIAGNOSIS — M25512 Pain in left shoulder: Secondary | ICD-10-CM | POA: Diagnosis not present

## 2017-07-23 DIAGNOSIS — F320 Major depressive disorder, single episode, mild: Secondary | ICD-10-CM

## 2017-07-23 DIAGNOSIS — E1142 Type 2 diabetes mellitus with diabetic polyneuropathy: Secondary | ICD-10-CM

## 2017-07-23 DIAGNOSIS — E785 Hyperlipidemia, unspecified: Principal | ICD-10-CM

## 2017-07-23 NOTE — Telephone Encounter (Signed)
Copied from [place] [phone]. Topic: Quick Communication - Rx Refill/Question >> Jul 23, 2017  2:11 PM Oliver Pila B wrote: Medication: metFORMIN (GLUCOPHAGE-XR) 750 MG 24 hr tablet [[id number]] , DULoxetine (CYMBALTA) 30 MG capsule [[id number]] , gabapentin (NEURONTIN) 300 MG capsule [[id number]]   Has the patient contacted their pharmacy? Yes.   (Agent: If no, request that the patient contact the pharmacy for the refill.) (Agent: If yes, when and what did the pharmacy advise?)  Preferred Pharmacy (with phone number [facility] street name): [facility]  Agent: Please be advised that RX refills may take up to 3 business days. We ask that you follow-up with your pharmacy.

## 2017-07-23 NOTE — Telephone Encounter (Signed)
Request for diabetes medication. Metformin and Cymbalta  Last office visit pertaining to diabetes: 06/05/17  Lab Results  Component Value Date   HGBA1C 6.8 06/05/2017     Follow up on

## 2017-07-24 ENCOUNTER — Ambulatory Visit
Admission: RE | Admit: 2017-07-24 | Discharge: 2017-07-24 | Disposition: A | Discharge status: Home / Self Care | Payer: Medicare HMO | Source: Ambulatory Visit | Attending: Nurse Practitioner | Admitting: Nurse Practitioner

## 2017-07-24 DIAGNOSIS — R928 Other abnormal and inconclusive findings on diagnostic imaging of breast: Secondary | ICD-10-CM

## 2017-07-24 DIAGNOSIS — N6489 Other specified disorders of breast: Secondary | ICD-10-CM

## 2017-07-25 NOTE — Telephone Encounter (Signed)
Can we get her in Thursday or Friday?

## 2017-07-25 NOTE — Telephone Encounter (Signed)
Pt appt 07-26-17 at 10am

## 2017-07-26 ENCOUNTER — Encounter: Payer: Self-pay | Admitting: Family Medicine

## 2017-07-26 ENCOUNTER — Ambulatory Visit (INDEPENDENT_AMBULATORY_CARE_PROVIDER_SITE_OTHER): Payer: Medicare HMO | Admitting: Family Medicine

## 2017-07-26 ENCOUNTER — Telehealth: Payer: Self-pay | Admitting: Family Medicine

## 2017-07-26 VITALS — BP 120/70 | HR 76 | Resp 16 | Ht 62.0 in | Wt 137.5 lb

## 2017-07-26 DIAGNOSIS — E1142 Type 2 diabetes mellitus with diabetic polyneuropathy: Secondary | ICD-10-CM | POA: Diagnosis not present

## 2017-07-26 DIAGNOSIS — E1169 Type 2 diabetes mellitus with other specified complication: Secondary | ICD-10-CM

## 2017-07-26 DIAGNOSIS — E785 Hyperlipidemia, unspecified: Secondary | ICD-10-CM | POA: Diagnosis not present

## 2017-07-26 DIAGNOSIS — F320 Major depressive disorder, single episode, mild: Secondary | ICD-10-CM | POA: Diagnosis not present

## 2017-07-26 DIAGNOSIS — R69 Illness, unspecified: Secondary | ICD-10-CM | POA: Diagnosis not present

## 2017-07-26 DIAGNOSIS — I10 Essential (primary) hypertension: Secondary | ICD-10-CM | POA: Diagnosis not present

## 2017-07-26 LAB — POCT GLUCOSE (DEVICE FOR HOME USE): Glucose Fasting, POC: 247 mg/dL — AB (ref 70–99)

## 2017-07-26 MED ORDER — TELMISARTAN-HCTZ 80-25 MG PO TABS: 1.0000 | ORAL_TABLET | Freq: Every day | ORAL | 0 refills | Status: DC

## 2017-07-26 MED ORDER — METFORMIN HCL ER 750 MG PO TB24: 1500.0000 mg | ORAL_TABLET | Freq: Every day | ORAL | 0 refills | Status: DC

## 2017-07-26 MED ORDER — DULOXETINE HCL 60 MG PO CPEP: 60.0000 mg | ORAL_CAPSULE | Freq: Every day | ORAL | 0 refills | Status: DC

## 2017-07-26 MED ORDER — GLIPIZIDE ER 5 MG PO TB24: 5.0000 mg | ORAL_TABLET | Freq: Every day | ORAL | 0 refills | Status: DC

## 2017-07-26 NOTE — Telephone Encounter (Signed)
Medication Clarification Duloxetine  Pharmacy called reporting they received a RX for Duloxetine 60 mg Capsules today    Directions say 30 mg first week then increase to 60 mg after, No Rx for the 30 mg  And the 60 mg capsule cannot be cut in half   LOV 07/26/2017 Dr Ancil Boozer   Rx written Today    Pharmacy [facility] - [address] [place]  [phone]

## 2017-07-26 NOTE — Telephone Encounter (Unsigned)
Copied from [place] [id number]. Topic: Quick Communication - See Telephone Encounter >> Jul 26, 2017 12:04 PM Hewitt Shorts wrote: [address] [place] is calling stating that the duloxetine instructions are to start with 30mg  and then go to the 60mg  but the pharmacy states that they have not seen a rx for 30 mg they just received 60mg   And this can not be cut in half  Best number [phone]

## 2017-07-26 NOTE — Progress Notes (Signed)
Name: Brooke Randall   MRN: [id number]    DOB: [birth year]   Date:07/26/2017       Progress Note  Subjective  Chief Complaint  Chief Complaint  Patient presents with  . Medication Refill    needs refills on Cymbalta, Gabapentin, Glipizide and Metformin.  Marland Kitchen Hypertension    HPI  Major Depression: retired in 2014, however children moved out of town, family is in [place] and she feels lonely and overwhelmed having to make all the decisions. She did not start Citalopram but started Duloxetine and states first two weeks had nausea with 30 mg after that she increased to 60 mg and nausea returned however willing to stick with medication for now.  HTN: taking medications, denies side effects, no chest pain or palpitation. Unchanged, compliant with medication   DMII: on ARB, she has retinopathy, neuropathy and dyslipidemia. She did not start Zetia could not tolerate statin therapy.  She is back on gabapentin and noticed mild improvement of her pain, but did not titrate dose up, advised to go to '200mg'$  at night and after a few days get rx of 300 mg to take one qhs.  She is also on Duloxetine now, has only noticed mild improvement of pain, but doing better overall. She was on a cruise for 12 days, forgot to take Glipizide and was also not compliant with her diet, she returned 2 weeks ago, glucose still higher than usual but improving, level today was non-fasting, at home fasting glucose has been 180-190 range, she states it is getting better since she has been back and wants to continue to monitor for now. She denies polyphagia, but has noticed increase in thirsty, but no  polyuria.   Impingement syndrome: seeing Dr. Mack Guise and Dr. Sabra Heck, had injection but still has pain. Pain is worse at night, radiates from neck to proximal arm, steroid injection helped temporarily only, on duloxetine and gabapentin now   Patient Active Problem List   Diagnosis Date Noted  . Arthropathy of spinal facet joint  06/05/2017  . Osteoarthritis of knee 06/05/2017  . Diabetic retinopathy associated with controlled type 2 diabetes mellitus ([place]) 06/05/2017  . Current moderate episode of major depressive disorder without prior episode ([place]) 05/14/2017  . Impingement syndrome of left shoulder region 03/22/2017  . Mass of right hand 04/21/2016  . Post-menopausal 03/31/2016  . Hyperlipidemia 12/17/2014  . Well controlled type 2 diabetes mellitus with peripheral neuropathy ([place]) 12/17/2014  . Hypertension 08/20/2014  . Vitamin D deficiency 08/20/2014  . Lumbar disc herniation with radiculopathy 09/12/2012    Past Surgical History:  Procedure Laterality Date  . FOOT SURGERY Right 1999    Family History  Problem Relation Age of Onset  . Hypertension Mother   . Hypertension Father   . Diabetes Father   . Hypertension Sister   . Diabetes Sister   . Hypertension Brother     Social History   Socioeconomic History  . Marital status: Divorced    Spouse name: Not on file  . Number of children: 2  . Years of education: Not on file  . Highest education level: Master's degree (e.g., MA, MS, MEng, MEd, MSW, MBA)  Occupational History  . Occupation: Unemployed    Comment: Looking for work  Social Needs  . Financial resource strain: Not hard at all  . Food insecurity:    Worry: Never true    Inability: Never true  . Transportation needs:    Medical: No    Non-medical: No  Tobacco Use  . Smoking status: Never Smoker  . Smokeless tobacco: Never Used  . Tobacco comment: smoking cessation maerials not required  Substance and Sexual Activity  . Alcohol use: No    Alcohol/week: 0.0 oz  . Drug use: No  . Sexual activity: Not Currently    Birth control/protection: Post-menopausal  Lifestyle  . Physical activity:    Days per week: 0 days    Minutes per session: 0 min  . Stress: Not at all  Relationships  . Social connections:    Talks on phone: Patient refused    Gets together: Patient refused     Attends religious service: Patient refused    Active member of club or organization: Patient refused    Attends meetings of clubs or organizations: Patient refused    Relationship status: Divorced  . Intimate partner violence:    Fear of current or ex partner: No    Emotionally abused: No    Physically abused: No    Forced sexual activity: No  Other Topics Concern  . Not on file  Social History Narrative   Retired, empty nested. No family around. She is Muslim     Current Outpatient Medications:  .  Calcium Carbonate-Vit D-Min (CALCIUM/VITAMIN D/MINERALS) 600-200 MG-UNIT TABS, Take 1 tablet by mouth daily., Disp: , Rfl:  .  DULoxetine (CYMBALTA) 60 MG capsule, Take 1 capsule (60 mg total) by mouth daily. 30 mg for the first week after that stay on 60 mg, Disp: 90 capsule, Rfl: 0 .  ezetimibe (ZETIA) 10 MG tablet, Take 1 tablet (10 mg total) by mouth daily., Disp: 90 tablet, Rfl: 3 .  gabapentin (NEURONTIN) 300 MG capsule, Take 1 capsule (300 mg total) by mouth at bedtime., Disp: 90 capsule, Rfl: 0 .  glipiZIDE (GLUCOTROL XL) 5 MG 24 hr tablet, Take 1 tablet (5 mg total) by mouth daily with breakfast., Disp: 90 tablet, Rfl: 0 .  metFORMIN (GLUCOPHAGE-XR) 750 MG 24 hr tablet, Take 2 tablets (1,500 mg total) by mouth daily with breakfast., Disp: 180 tablet, Rfl: 0 .  telmisartan-hydrochlorothiazide (MICARDIS HCT) 80-25 MG tablet, Take 1 tablet by mouth daily., Disp: 90 tablet, Rfl: 0 .  temazepam (RESTORIL) 15 MG capsule, Take 1 capsule (15 mg total) by mouth at bedtime as needed for sleep., Disp: 30 capsule, Rfl: 1 .  tizanidine (ZANAFLEX) 2 MG capsule, Take 1 capsule (2 mg total) by mouth 2 (two) times daily., Disp: 30 capsule, Rfl: 0  Allergies  Allergen Reactions  . Statins     Muscle aches     ROS  Constitutional: Negative for fever or weight change.  Respiratory: Negative for cough and shortness of breath.   Cardiovascular: Negative for chest pain or palpitations.   Gastrointestinal: Negative for abdominal pain, no bowel changes. She has nausea since started on duloxetine  Musculoskeletal: Negative for gait problem or joint swelling.  Skin: Negative for rash.  Neurological: Negative for dizziness or headache.  No other specific complaints in a complete review of systems (except as listed in HPI above).  Objective  Vitals:   07/26/17 1002  BP: 120/70  Pulse: 76  Resp: 16  SpO2: 96%  Weight: 137 lb 8 oz (62.4 kg)  Height: _0  (1.575 m)    Body mass index is 25.15 kg/m.  Physical Exam  Constitutional: Patient appears well-developed and well-nourished.  No distress.  HEENT: head atraumatic, normocephalic, pupils equal and reactive to light,  neck supple, throat within normal limits Cardiovascular: Normal  rate, regular rhythm and normal heart sounds.  No murmur heard. No BLE edema. Pulmonary/Chest: Effort normal and breath sounds normal. No respiratory distress. Abdominal: Soft.  There is no tenderness. Psychiatric: Patient has a normal mood and affect. behavior is normal. Judgment and thought content normal.  Recent Results (from the past 2160 hour(s))  CBC     Status: None   Collection Time: 05/07/17 12:25 PM  Result Value Ref Range   WBC 7.7 3.8 - 10.8 Thousand/uL   RBC 4.54 3.80 - 5.10 Million/uL   Hemoglobin 13.3 11.7 - 15.5 g/dL   HCT 38.6 35.0 - 45.0 %   MCV 85.0 80.0 - 100.0 fL   MCH 29.3 27.0 - 33.0 pg   MCHC 34.5 32.0 - 36.0 g/dL   RDW 12.9 11.0 - 15.0 %   Platelets 324 140 - 400 Thousand/uL   MPV 10.8 7.5 - 12.5 fL  CK (Creatine Kinase)     Status: None   Collection Time: 05/07/17 12:25 PM  Result Value Ref Range   Total CK 55 29 - 143 U/L  Vitamin D (25 hydroxy)     Status: None   Collection Time: 05/07/17 12:25 PM  Result Value Ref Range   Vit D, 25-Hydroxy 51 30 - 100 ng/mL    Comment: Vitamin D Status         25-OH Vitamin D: . Deficiency:                    <20 ng/mL Insufficiency:             20 - 29  ng/mL Optimal:                 > or = 30 ng/mL . For 25-OH Vitamin D testing on patients on  D2-supplementation and patients for whom quantitation  of D2 and D3 fractions is required, the QuestAssureD(TM) 25-OH VIT D, (D2,D3), LC/MS/MS is recommended: order  code [id number] (patients >42yr). . For more information on this test, go to: http://education.questdiagnostics.com/faq/FAQ163 (This link is being provided for  informational/educational purposes only.)   TSH     Status: None   Collection Time: 05/07/17 12:25 PM  Result Value Ref Range   TSH 2.59 0.40 - 4.50 mIU/L  Sed Rate (ESR)     Status: None   Collection Time: 05/07/17 12:25 PM  Result Value Ref Range   Sed Rate 9 0 - 30 mm/h  C-reactive protein     Status: None   Collection Time: 05/07/17 12:25 PM  Result Value Ref Range   CRP 2.1 <8.0 mg/L  Magnesium     Status: None   Collection Time: 05/07/17 12:25 PM  Result Value Ref Range   Magnesium 2.2 1.5 - 2.5 mg/dL  Lipid panel     Status: Abnormal   Collection Time: 05/07/17 12:25 PM  Result Value Ref Range   Cholesterol 218 (H) <200 mg/dL   HDL 49 (L) >50 mg/dL   Triglycerides 199 (H) <150 mg/dL   LDL Cholesterol (Calc) 135 (H) mg/dL (calc)    Comment: Reference range: <100 . Desirable range <100 mg/dL for primary prevention;   <70 mg/dL for patients with CHD or diabetic patients  with > or = 2 CHD risk factors. .Marland KitchenLDL-C is now calculated using the Martin-Hopkins  calculation, which is a validated novel method providing  better accuracy than the Friedewald equation in the  estimation of LDL-C.  MCresenciano Genreet al. JAnnamaria Helling 23329;518(84: 2061-2068  (http://education.[facility].com/faq/FAQ164)  Total CHOL/HDL Ratio 4.4 <5.0 (calc)   Non-HDL Cholesterol (Calc) 169 (H) <130 mg/dL (calc)    Comment: For patients with diabetes plus 1 major ASCVD risk  factor, treating to a non-HDL-C goal of <100 mg/dL  (LDL-C of <70 mg/dL) is considered a therapeutic  option.    COMPLETE METABOLIC PANEL WITH GFR     Status: None   Collection Time: 05/07/17 12:25 PM  Result Value Ref Range   Glucose, Bld 132 65 - 139 mg/dL    Comment: .        Non-fasting reference interval .    BUN 18 7 - 25 mg/dL   Creat 0.79 0.50 - 0.99 mg/dL    Comment: For patients >26 years of age, the reference limit for Creatinine is approximately 13% higher for people identified as African-American. .    GFR, Est Non African American 78 > OR = 60 mL/min/1.22m   GFR, Est African American 90 > OR = 60 mL/min/1.754m  BUN/Creatinine Ratio NOT APPLICABLE 6 - 22 (calc)   Sodium 137 135 - 146 mmol/L   Potassium 3.9 3.5 - 5.3 mmol/L   Chloride 103 98 - 110 mmol/L   CO2 28 20 - 32 mmol/L   Calcium 10.1 8.6 - 10.4 mg/dL   Total Protein 6.6 6.1 - 8.1 g/dL   Albumin 4.3 3.6 - 5.1 g/dL   Globulin 2.3 1.9 - 3.7 g/dL (calc)   AG Ratio 1.9 1.0 - 2.5 (calc)   Total Bilirubin 0.7 0.2 - 1.2 mg/dL   Alkaline phosphatase (APISO) 59 33 - 130 U/L   AST 13 10 - 35 U/L   ALT 13 6 - 29 U/L  POCT HgB A1C     Status: None   Collection Time: 06/05/17  2:13 PM  Result Value Ref Range   Hemoglobin A1C 6.8   Vitamin B12     Status: None   Collection Time: 06/05/17  2:41 PM  Result Value Ref Range   Vitamin B-12 502 200 - 1,100 pg/mL  POCT UA - Microalbumin     Status: Normal   Collection Time: 06/05/17  3:28 PM  Result Value Ref Range   Microalbumin Ur, POC Neg mg/L   Creatinine, POC  mg/dL   Albumin/Creatinine Ratio, Urine, POC    POCT Glucose (Device for Home Use)     Status: Abnormal   Collection Time: 07/26/17 10:22 AM  Result Value Ref Range   Glucose Fasting, POC 247 (A) 70 - 99 mg/dL   POC Glucose  70 - 99 mg/dl     PH[facility]9: [facility] 07/26/2017 05/07/2017 05/07/2017 02/27/2017 01/08/2017  Decreased Interest 1 1 0 0 0  Down, Depressed, Hopeless 1 2 0 0 0  PHQ - 2 Score 2 3 0 0 0  Altered sleeping 1 2 - - -  Tired, decreased energy 2 3 - - -  Change in appetite 1 1 -  - -  Feeling bad or failure about yourself  0 0 - - -  Trouble concentrating 0 1 - - -  Moving slowly or fidgety/restless 0 0 - - -  Suicidal thoughts 0 0 - - -  PHQ-9 Score 6 10 - - -  Difficult doing work/chores - Somewhat difficult - - -     Fall Risk: Fall Risk  07/26/2017 07/26/2017 05/07/2017 02/27/2017 01/08/2017  Falls in the past year? No No Yes No No  Number falls in past yr: - - 1 - -  Injury with Fall? - - No - -     Assessment & Plan  1. Well controlled type 2 diabetes mellitus with peripheral neuropathy ([place])  She has resumed a diet since a 12 day cruise - POCT Glucose (Device for Home Use) - DULoxetine (CYMBALTA) 60 MG capsule; Take 1 capsule (60 mg total) by mouth daily. 30 mg for the first week after that stay on 60 mg  Dispense: 90 capsule; Refill: 0 - glipiZIDE (GLUCOTROL XL) 5 MG 24 hr tablet; Take 1 tablet (5 mg total) by mouth daily with breakfast.  Dispense: 90 tablet; Refill: 0  2. Depression, major, single episode, mild (HCC)  - DULoxetine (CYMBALTA) 60 MG capsule; Take 1 capsule (60 mg total) by mouth daily. 30 mg for the first week after that stay on 60 mg  Dispense: 90 capsule; Refill: 0  3. Dyslipidemia associated with type 2 diabetes mellitus ([facility])  - glipiZIDE (GLUCOTROL XL) 5 MG 24 hr tablet; Take 1 tablet (5 mg total) by mouth daily with breakfast.  Dispense: 90 tablet; Refill: 0 - metFORMIN (GLUCOPHAGE-XR) 750 MG 24 hr tablet; Take 2 tablets (1,500 mg total) by mouth daily with breakfast.  Dispense: 180 tablet; Refill: 0  Needs to start Zetia   4. Essential hypertension  - telmisartan-hydrochlorothiazide (MICARDIS HCT) 80-25 MG tablet; Take 1 tablet by mouth daily.  Dispense: 90 tablet; Refill: 0

## 2017-07-27 MED ORDER — DULOXETINE HCL 60 MG PO CPEP: 60.0000 mg | ORAL_CAPSULE | Freq: Every day | ORAL | 0 refills | Status: DC

## 2017-07-27 NOTE — Telephone Encounter (Signed)
Patient notified medication sent into [facility] for Duloxetine 60 mg once daily.

## 2017-07-27 NOTE — Telephone Encounter (Signed)
Sent new rx with correct instruction, to take one duloxetine 60 mg daily

## 2017-07-30 ENCOUNTER — Telehealth: Payer: Self-pay | Admitting: Nurse Practitioner

## 2017-07-30 DIAGNOSIS — M79672 Pain in left foot: Secondary | ICD-10-CM | POA: Diagnosis not present

## 2017-07-30 DIAGNOSIS — L600 Ingrowing nail: Secondary | ICD-10-CM | POA: Diagnosis not present

## 2017-07-30 DIAGNOSIS — M79671 Pain in right foot: Secondary | ICD-10-CM | POA: Diagnosis not present

## 2017-07-30 DIAGNOSIS — B351 Tinea unguium: Secondary | ICD-10-CM | POA: Diagnosis not present

## 2017-07-30 DIAGNOSIS — G5761 Lesion of plantar nerve, right lower limb: Secondary | ICD-10-CM | POA: Diagnosis not present

## 2017-07-30 NOTE — Telephone Encounter (Signed)
Please call to request patient to schedule [place] of right breast- mammogram recommended further evaluation.

## 2017-08-27 ENCOUNTER — Telehealth: Payer: Self-pay

## 2017-08-27 MED ORDER — NORTRIPTYLINE HCL 25 MG PO CAPS: 25.0000 mg | ORAL_CAPSULE | Freq: Every day | ORAL | 0 refills | Status: DC

## 2017-08-27 NOTE — Telephone Encounter (Signed)
Sent nortriptyline

## 2017-08-27 NOTE — Telephone Encounter (Signed)
Patient Insurance Medicare Part D does not cover Duloxetine. Alternatives are: mirtazapine, citalopram, fluoxetine, sertraline, and trazodone. Insurance denial coverage was given to Raelyn Ensign, [place] by Dr. Ancil Boozer and Raquel Sarna suggested her staying on Lexapro and not filing appeal for Duloxetine.  Also Gabapentin is not approved and choices are Trazodone, Tramadol, Nortriptyline capsule.

## 2017-08-28 NOTE — Telephone Encounter (Signed)
I sent patient a MyChart message to inform her of change of medication from Gabapentin to Nortiptyline. Please advise about Duloxetine coverage.

## 2017-08-28 NOTE — Telephone Encounter (Signed)
Not covered, so she needs to take lexapro given by Benjamine Mola on her last visit . Please ask if she needs a refill. It also needs to be re-activated on her active medication list

## 2017-08-29 ENCOUNTER — Telehealth: Payer: Self-pay

## 2017-08-29 ENCOUNTER — Telehealth: Payer: Self-pay | Admitting: Family Medicine

## 2017-08-29 ENCOUNTER — Other Ambulatory Visit: Payer: Self-pay

## 2017-08-29 MED ORDER — ONETOUCH ULTRASOFT LANCETS MISC: 12 refills | Status: DC

## 2017-08-29 MED ORDER — GLUCOSE BLOOD VI STRP: ORAL_STRIP | 12 refills | Status: DC

## 2017-08-29 NOTE — Telephone Encounter (Signed)
Copied from [place] [phone]. Topic: Inquiry >> Aug 29, 2017  2:48 PM Pricilla Handler wrote: Reason for CRM: Patient states that she needs authorzation approved from our office for her medications. Patient stated that the two medications she has requested needs authorizations before the pharmacy will fill them for the patient. Please call Lesieli Koehn today at [phone]. Patient that she called on 08/17/2017, but never received a return call or answer.       Thank You!!!  I tried to contact this patient to see which medication it is that she needs auth but there was no answer. A message was left for her to give our office a call back when she got the chance.

## 2017-08-29 NOTE — Telephone Encounter (Signed)
Pt called in to provide clarity. Pt says that 2 medications is Duloxetine and also Gabapentin.   Pt has been advised that these medications were discontinued. Pt says that she does not want to change medications.    Pt says that her new pharmacy is [facility]

## 2017-08-29 NOTE — Telephone Encounter (Signed)
Copied from [place] [id number]. Topic: Inquiry >> Aug 29, 2017  2:53 PM Pricilla Handler wrote: Reason for CRM: Patient called stating that she need a refill of One Touch Ultra II's Lancets and Test Stripts. Please call patient today. Patient's preferred pharmacy is [place], [place] - [address] [phone] (Phone)  [phone] (Fax).       Thank You!!!   Refill request was sent to Dr. Steele Sizer for approval and submission.

## 2017-08-29 NOTE — Telephone Encounter (Signed)
Copied from [place] [phone]. Topic: Inquiry >> Aug 29, 2017  2:53 PM Pricilla Handler wrote: Reason for CRM: Patient called stating that she need a refill of One Touch Ultra II's Lancets and Test Stripts. Please call patient today. Patient's preferred pharmacy is [place], [place] - [place] [phone] (Phone)  [phone] (Fax).       Thank You!!!

## 2017-08-30 DIAGNOSIS — R69 Illness, unspecified: Secondary | ICD-10-CM | POA: Diagnosis not present

## 2017-08-30 NOTE — Telephone Encounter (Signed)
I contacted this patient's insurance company and they said that a Tier 2 exception had to be completed. It was iniatiated and now it is being processed.

## 2017-08-30 NOTE — Telephone Encounter (Signed)
Insurance denied the coverage of duloxetine and also gabapentin, that is the reason why she needs to take pamelor 25 qhs and lexapro. Unless if there was any other paperwork showing that it was approved. Should I resend rx?

## 2017-08-30 NOTE — Telephone Encounter (Signed)
Please advise if it is ok to resend these medications to her preferred pharmacy

## 2017-08-30 NOTE — Telephone Encounter (Signed)
Rx refilled by office 08/29/17

## 2017-08-31 ENCOUNTER — Telehealth: Payer: Self-pay

## 2017-08-31 ENCOUNTER — Other Ambulatory Visit: Payer: Self-pay | Admitting: Family Medicine

## 2017-08-31 DIAGNOSIS — R69 Illness, unspecified: Secondary | ICD-10-CM | POA: Diagnosis not present

## 2017-08-31 MED ORDER — GLUCOSE BLOOD VI STRP: ORAL_STRIP | 12 refills | Status: DC

## 2017-08-31 NOTE — Telephone Encounter (Signed)
Copied from [place] [phone]. Topic: Inquiry >> Aug 29, 2017  2:48 PM Pricilla Handler wrote: Reason for CRM: Patient states that she needs authorzation approved from our office for her medications. Patient stated that the two medications she has requested needs authorizations before the pharmacy will fill them for the patient. Please call Tearia Purewal today at [phone]. Patient that she called on 08/17/2017, but never received a return call or answer.       Thank You!!!  >> Aug 31, 2017  3:46 PM Ivar Drape wrote: Erasmo Downer w/[employer] [phone] needs to ask a couple of questions from Clinical before they can approve the patient's Gabapentin and the Duloxetine prescriptions.  Please call before the end of the day.  I returned Kristin's call and answered her questions for the tier 2 reduction process.

## 2017-09-03 DIAGNOSIS — M5412 Radiculopathy, cervical region: Secondary | ICD-10-CM | POA: Diagnosis not present

## 2017-09-03 DIAGNOSIS — M25512 Pain in left shoulder: Secondary | ICD-10-CM | POA: Diagnosis not present

## 2017-09-10 DIAGNOSIS — M5412 Radiculopathy, cervical region: Secondary | ICD-10-CM | POA: Diagnosis not present

## 2017-09-10 DIAGNOSIS — M4802 Spinal stenosis, cervical region: Secondary | ICD-10-CM | POA: Diagnosis not present

## 2017-09-10 DIAGNOSIS — S46112A Strain of muscle, fascia and tendon of long head of biceps, left arm, initial encounter: Secondary | ICD-10-CM | POA: Diagnosis not present

## 2017-09-10 DIAGNOSIS — M75122 Complete rotator cuff tear or rupture of left shoulder, not specified as traumatic: Secondary | ICD-10-CM | POA: Diagnosis not present

## 2017-10-05 DIAGNOSIS — S46102A Unspecified injury of muscle, fascia and tendon of long head of biceps, left arm, initial encounter: Secondary | ICD-10-CM | POA: Diagnosis not present

## 2017-10-05 DIAGNOSIS — M24112 Other articular cartilage disorders, left shoulder: Secondary | ICD-10-CM | POA: Diagnosis not present

## 2017-10-05 DIAGNOSIS — M75112 Incomplete rotator cuff tear or rupture of left shoulder, not specified as traumatic: Secondary | ICD-10-CM | POA: Diagnosis not present

## 2017-10-05 DIAGNOSIS — M7582 Other shoulder lesions, left shoulder: Secondary | ICD-10-CM | POA: Insufficient documentation

## 2017-10-26 ENCOUNTER — Encounter: Payer: Self-pay | Admitting: Family Medicine

## 2017-10-26 ENCOUNTER — Ambulatory Visit (INDEPENDENT_AMBULATORY_CARE_PROVIDER_SITE_OTHER): Payer: Medicare HMO | Admitting: Family Medicine

## 2017-10-26 VITALS — BP 114/66 | HR 96 | Temp 98.2°F | Resp 16 | Ht 62.0 in | Wt 139.9 lb

## 2017-10-26 DIAGNOSIS — E11319 Type 2 diabetes mellitus with unspecified diabetic retinopathy without macular edema: Secondary | ICD-10-CM | POA: Diagnosis not present

## 2017-10-26 DIAGNOSIS — Z23 Encounter for immunization: Secondary | ICD-10-CM

## 2017-10-26 DIAGNOSIS — G4700 Insomnia, unspecified: Secondary | ICD-10-CM | POA: Diagnosis not present

## 2017-10-26 DIAGNOSIS — M7542 Impingement syndrome of left shoulder: Secondary | ICD-10-CM | POA: Diagnosis not present

## 2017-10-26 DIAGNOSIS — E1169 Type 2 diabetes mellitus with other specified complication: Secondary | ICD-10-CM

## 2017-10-26 DIAGNOSIS — R69 Illness, unspecified: Secondary | ICD-10-CM | POA: Diagnosis not present

## 2017-10-26 DIAGNOSIS — E1142 Type 2 diabetes mellitus with diabetic polyneuropathy: Secondary | ICD-10-CM

## 2017-10-26 DIAGNOSIS — F320 Major depressive disorder, single episode, mild: Secondary | ICD-10-CM

## 2017-10-26 DIAGNOSIS — E785 Hyperlipidemia, unspecified: Secondary | ICD-10-CM | POA: Diagnosis not present

## 2017-10-26 DIAGNOSIS — R8290 Unspecified abnormal findings in urine: Secondary | ICD-10-CM

## 2017-10-26 DIAGNOSIS — I10 Essential (primary) hypertension: Secondary | ICD-10-CM

## 2017-10-26 MED ORDER — TELMISARTAN-HCTZ 80-12.5 MG PO TABS: 1.0000 | ORAL_TABLET | Freq: Every day | ORAL | 1 refills | Status: DC

## 2017-10-26 MED ORDER — GLIPIZIDE ER 5 MG PO TB24: 5.0000 mg | ORAL_TABLET | Freq: Every day | ORAL | 1 refills | Status: DC

## 2017-10-26 MED ORDER — METFORMIN HCL ER 750 MG PO TB24: 1500.0000 mg | ORAL_TABLET | Freq: Every day | ORAL | 1 refills | Status: DC

## 2017-10-26 MED ORDER — DULOXETINE HCL 60 MG PO CPEP: 60.0000 mg | ORAL_CAPSULE | Freq: Every day | ORAL | 1 refills | Status: DC

## 2017-10-26 NOTE — Progress Notes (Signed)
Name: Brooke Randall   MRN: [id number]    DOB: 06/16/1950   Date:10/26/2017       Progress Note  Subjective  Chief Complaint  Chief Complaint  Patient presents with  . Medication Refill  . Hypertension    Denies any symptoms  . Diabetes    Fasting Sugars has been 170-180, 3 times a week  . Depression    HPI  Major Depression: retired in 2014, however children moved out of town, family is in [place] and she feels lonely and overwhelmed having to make all the decisions. She is doing well on duloxetine 60 mg daily, states initial nausea resolved. Thinking about moving back to [place] to be close to family  HTN: taking medications, denies side effects, no chest pain or palpitation. Denies dizziness, but SBP is towards low end of normal and we will decrease dose of diuretic from 25-12.5 mg   DMII: on ARB, she has retinopathy, neuropathy and dyslipidemia. She did not start Zetia she states she thinks it was too expensive. She did not bring her medications with her today.   She is off Gabapentin again because it made her feel very sleepy.  She is also on Duloxetine now, has only noticed mild improvement of pain, but doing better overall. She states tried glipizide XL in the morning but it caused hypoglycemia, so she has been taking it at night for the past 2 months, she denies having hypoglycemic episodes at night, but states glucose in am still in the 170-180's. Up to date with eye exam. We will recheck hgbA1C  Impingement syndrome: seeing Dr. Mack Guise and Dr. Sabra Heck, had injection but still has pain. Pain is worse at night. She had MRI that showed multiple tears and needs surgery but she is worried about recovery.   Urine odor: she has a long history of nocturia, but recently waking up and noticed urine odor, but normal color, no dysuria, no fever or chills.   Patient Active Problem List   Diagnosis Date Noted  . Injury of tendon of long head of left biceps 10/05/2017  . Nontraumatic  incomplete tear of left rotator cuff 10/05/2017  . Rotator cuff tendinitis, left 10/05/2017  . Degenerative tear of glenoid labrum of left shoulder 10/05/2017  . Arthropathy of spinal facet joint 06/05/2017  . Osteoarthritis of knee 06/05/2017  . Diabetic retinopathy associated with controlled type 2 diabetes mellitus ([place]) 06/05/2017  . Current moderate episode of major depressive disorder without prior episode ([place]) 05/14/2017  . Impingement syndrome of left shoulder region 03/22/2017  . Mass of right hand 04/21/2016  . Post-menopausal 03/31/2016  . Hyperlipidemia 12/17/2014  . Well controlled type 2 diabetes mellitus with peripheral neuropathy ([place]) 12/17/2014  . Hypertension 08/20/2014  . Vitamin D deficiency 08/20/2014  . Lumbar disc herniation with radiculopathy 09/12/2012    Past Surgical History:  Procedure Laterality Date  . FOOT SURGERY Right 1999    Family History  Problem Relation Age of Onset  . Hypertension Mother   . Hypertension Father   . Diabetes Father   . Hypertension Sister   . Diabetes Sister   . Hypertension Brother     Social History   Socioeconomic History  . Marital status: Divorced    Spouse name: Not on file  . Number of children: 2  . Years of education: Not on file  . Highest education level: Master's degree (e.g., MA, MS, MEng, MEd, MSW, MBA)  Occupational History  . Occupation: Unemployed    Comment:  Looking for work  Social Needs  . Financial resource strain: Not hard at all  . Food insecurity:    Worry: Never true    Inability: Never true  . Transportation needs:    Medical: No    Non-medical: No  Tobacco Use  . Smoking status: Never Smoker  . Smokeless tobacco: Never Used  . Tobacco comment: smoking cessation maerials not required  Substance and Sexual Activity  . Alcohol use: No    Alcohol/week: 0.0 standard drinks  . Drug use: No  . Sexual activity: Not Currently    Birth control/protection: Post-menopausal  Lifestyle   . Physical activity:    Days per week: 0 days    Minutes per session: 0 min  . Stress: Not at all  Relationships  . Social connections:    Talks on phone: Patient refused    Gets together: Patient refused    Attends religious service: Patient refused    Active member of club or organization: Patient refused    Attends meetings of clubs or organizations: Patient refused    Relationship status: Divorced  . Intimate partner violence:    Fear of current or ex partner: No    Emotionally abused: No    Physically abused: No    Forced sexual activity: No  Other Topics Concern  . Not on file  Social History Narrative   Retired, empty nested. No family around. She is Muslim     Current Outpatient Medications:  .  Calcium Carbonate-Vit D-Min (CALCIUM/VITAMIN D/MINERALS) 600-200 MG-UNIT TABS, Take 1 tablet by mouth daily., Disp: , Rfl:  .  DULoxetine (CYMBALTA) 60 MG capsule, Take 1 capsule (60 mg total) by mouth daily., Disp: 90 capsule, Rfl: 1 .  ezetimibe (ZETIA) 10 MG tablet, Take 1 tablet (10 mg total) by mouth daily., Disp: 90 tablet, Rfl: 3 .  glipiZIDE (GLUCOTROL XL) 5 MG 24 hr tablet, Take 1 tablet (5 mg total) by mouth daily with breakfast., Disp: 90 tablet, Rfl: 1 .  glucose blood (ONE TOUCH ULTRA TEST) test strip, BID, Disp: 100 each, Rfl: 12 .  Lancets (ONETOUCH ULTRASOFT) lancets, Use as instructed, Disp: 100 each, Rfl: 12 .  metFORMIN (GLUCOPHAGE-XR) 750 MG 24 hr tablet, Take 2 tablets (1,500 mg total) by mouth daily with breakfast., Disp: 180 tablet, Rfl: 1 .  nortriptyline (PAMELOR) 25 MG capsule, Take 1 capsule (25 mg total) by mouth at bedtime., Disp: 90 capsule, Rfl: 0 .  temazepam (RESTORIL) 15 MG capsule, Take 1 capsule (15 mg total) by mouth at bedtime as needed for sleep., Disp: 30 capsule, Rfl: 1 .  telmisartan-hydrochlorothiazide (MICARDIS HCT) 80-12.5 MG tablet, Take 1 tablet by mouth daily., Disp: 90 tablet, Rfl: 1  Allergies  Allergen Reactions  . Statins      Muscle aches    I personally reviewed active problem list, social, family and surgical history  with the patient/caregiver today.   ROS  Constitutional: Negative for fever or weight change.  Respiratory: Negative for cough and shortness of breath.   Cardiovascular: Negative for chest pain or palpitations.  Gastrointestinal: Negative for abdominal pain, no bowel changes.  Musculoskeletal: Negative for gait problem or joint swelling.  Skin: Negative for rash.  Neurological: Negative for dizziness or headache.  No other specific complaints in a complete review of systems (except as listed in HPI above).  Objective  Vitals:   10/26/17 1408  BP: 114/66  Pulse: 96  Resp: 16  Temp: 98.2 F (36.8 C)  TempSrc:  Oral  SpO2: 96%  Weight: 139 lb 14.4 oz (63.5 kg)  Height: 5\' 2"  (1.575 m)    Body mass index is 25.59 kg/m.  Physical Exam  Constitutional: Patient appears well-developed and well-nourished. Obese No distress.  HEENT: head atraumatic, normocephalic, pupils equal and reactive to light, neck supple, throat within normal limits Cardiovascular: Normal rate, regular rhythm and normal heart sounds.  No murmur heard. No BLE edema. Pulmonary/Chest: Effort normal and breath sounds normal. No respiratory distress. Abdominal: Soft.  There is no tenderness. Psychiatric: Patient has a normal mood and affect. behavior is normal. Judgment and thought content normal.  PHQ2/9: Depression screen [facility] 2/9 10/26/2017 07/26/2017 05/07/2017 05/07/2017 02/27/2017  Decreased Interest 0 1 1 0 0  Down, Depressed, Hopeless 0 1 2 0 0  PHQ - 2 Score 0 2 3 0 0  Altered sleeping 0 1 2 - -  Tired, decreased energy 1 2 3  - -  Change in appetite 0 1 1 - -  Feeling bad or failure about yourself  0 0 0 - -  Trouble concentrating 0 0 1 - -  Moving slowly or fidgety/restless 0 0 0 - -  Suicidal thoughts 0 0 0 - -  PHQ-9 Score 1 6 10  - -  Difficult doing work/chores Somewhat difficult - Somewhat difficult  - -     Fall Risk: Fall Risk  10/26/2017 07/26/2017 07/26/2017 05/07/2017 02/27/2017  Falls in the past year? No No No Yes No  Number falls in past yr: - - - 1 -  Injury with Fall? - - - No -     Functional Status Survey: Is the patient deaf or have difficulty hearing?: No Does the patient have difficulty seeing, even when wearing glasses/contacts?: Yes(reading glasses) Does the patient have difficulty concentrating, remembering, or making decisions?: No Does the patient have difficulty walking or climbing stairs?: No Does the patient have difficulty dressing or bathing?: No Does the patient have difficulty doing errands alone such as visiting a doctor's office or shopping?: No   Assessment & Plan  1. Well controlled type 2 diabetes mellitus with peripheral neuropathy ([place])  We will recheck hgbA1C, she has been taking medication at night  Instead of am because she was getting hypoglycemic, explained that she can try taking before lunch and we may need to go down on the dose instead of up  - glipiZIDE (GLUCOTROL XL) 5 MG 24 hr tablet; Take 1 tablet (5 mg total) by mouth daily with breakfast.  Dispense: 90 tablet; Refill: 1 - Hemoglobin A1c  2. Dyslipidemia associated with type 2 diabetes mellitus ([facility])  - glipiZIDE (GLUCOTROL XL) 5 MG 24 hr tablet; Take 1 tablet (5 mg total) by mouth daily with breakfast.  Dispense: 90 tablet; Refill: 1 - metFORMIN (GLUCOPHAGE-XR) 750 MG 24 hr tablet; Take 2 tablets (1,500 mg total) by mouth daily with breakfast.  Dispense: 180 tablet; Refill: 1 - Lipid panel  3. Essential hypertension  bp towards low end of normal we will decrease hctz from 25 to 12.5 mg and monitor  - telmisartan-hydrochlorothiazide (MICARDIS HCT) 80-12.5 MG tablet; Take 1 tablet by mouth daily.  Dispense: 90 tablet; Refill: 1 - COMPLETE METABOLIC PANEL WITH GFR - CBC with Differential/Platelet  4. Insomnia, unspecified type  She is not sure if she is taking pamelor, we will  call pharmacy. She takes Temazepam very seldom   5. Impingement syndrome of left shoulder region  Seeing Ortho, may need surgery   6. Diabetic retinopathy  associated with controlled type 2 diabetes mellitus ([place])  She goes yearly   7. Depression, major, single episode, mild ([facility])  - DULoxetine (CYMBALTA) 60 MG capsule; Take 1 capsule (60 mg total) by mouth daily.  Dispense: 90 capsule; Refill: 1  8. Bad odor of urine  - CULTURE, URINE COMPREHENSIVE  9. Needs flu shot  - Flu vaccine HIGH DOSE PF

## 2017-10-28 LAB — COMPLETE METABOLIC PANEL WITH GFR
AG Ratio: 1.6 (calc) (ref 1.0–2.5)
ALKALINE PHOSPHATASE (APISO): 67 U/L (ref 33–130)
ALT: 19 U/L (ref 6–29)
AST: 17 U/L (ref 10–35)
Albumin: 4.1 g/dL (ref 3.6–5.1)
BILIRUBIN TOTAL: 0.8 mg/dL (ref 0.2–1.2)
BUN: 10 mg/dL (ref 7–25)
CHLORIDE: 101 mmol/L (ref 98–110)
CO2: 24 mmol/L (ref 20–32)
CREATININE: 0.91 mg/dL (ref 0.50–0.99)
Calcium: 9.8 mg/dL (ref 8.6–10.4)
GFR, Est African American: 76 mL/min/{1.73_m2} (ref >=60)
GFR, Est Non African American: 66 mL/min/{1.73_m2} (ref >=60)
GLUCOSE: 193 mg/dL — AB (ref 65–99)
Globulin: 2.5 g/dL (calc) (ref 1.9–3.7)
Potassium: 3.9 mmol/L (ref 3.5–5.3)
Sodium: 135 mmol/L (ref 135–146)
Total Protein: 6.6 g/dL (ref 6.1–8.1)

## 2017-10-28 LAB — CULTURE, URINE COMPREHENSIVE
MICRO NUMBER:: 91105157
RESULT:: NO GROWTH
SPECIMEN QUALITY: ADEQUATE

## 2017-10-28 LAB — LIPID PANEL
CHOL/HDL RATIO: 5.3 (calc) — AB (ref <5.0)
Cholesterol: 192 mg/dL (ref <200)
HDL: 36 mg/dL — AB (ref >50)
LDL CHOLESTEROL (CALC): 112 mg/dL — AB
Non-HDL Cholesterol (Calc): 156 mg/dL (calc) — ABNORMAL HIGH (ref <130)
Triglycerides: 328 mg/dL — ABNORMAL HIGH (ref <150)

## 2017-10-28 LAB — CBC WITH DIFFERENTIAL/PLATELET
BASOS PCT: 0.8 %
Basophils Absolute: 42 cells/uL (ref 0–200)
EOS ABS: 172 {cells}/uL (ref 15–500)
Eosinophils Relative: 3.3 %
HCT: 40.1 % (ref 35.0–45.0)
HEMOGLOBIN: 13.6 g/dL (ref 11.7–15.5)
LYMPHS ABS: 1383 {cells}/uL (ref 850–3900)
MCH: 28.5 pg (ref 27.0–33.0)
MCHC: 33.9 g/dL (ref 32.0–36.0)
MCV: 83.9 fL (ref 80.0–100.0)
MPV: 11.1 fL (ref 7.5–12.5)
Monocytes Relative: 8.5 %
NEUTROS ABS: 3162 {cells}/uL (ref 1500–7800)
Neutrophils Relative %: 60.8 %
PLATELETS: 306 10*3/uL (ref 140–400)
RBC: 4.78 10*6/uL (ref 3.80–5.10)
RDW: 12.8 % (ref 11.0–15.0)
Total Lymphocyte: 26.6 %
WBC: 5.2 10*3/uL (ref 3.8–10.8)
WBCMIX: 442 {cells}/uL (ref 200–950)

## 2017-10-28 LAB — HEMOGLOBIN A1C
EAG (MMOL/L): 8.5 (calc)
Hgb A1c MFr Bld: 7 % of total Hgb — ABNORMAL HIGH (ref <5.7)
Mean Plasma Glucose: 154 (calc)

## 2017-10-31 ENCOUNTER — Other Ambulatory Visit: Payer: Self-pay

## 2017-10-31 NOTE — Telephone Encounter (Signed)
Telmisart HCTZ 80-25 mg is on back order and [facility] asked [place] to switch to another medication.

## 2017-11-01 MED ORDER — OLMESARTAN MEDOXOMIL-HCTZ 40-25 MG PO TABS: 0.5000 | ORAL_TABLET | Freq: Every day | ORAL | 0 refills | Status: DC

## 2017-12-25 ENCOUNTER — Ambulatory Visit: Payer: Self-pay

## 2017-12-25 NOTE — Telephone Encounter (Signed)
Pt called with C/O sciatica pain to her left lower back traveling down her leg to her foot. She say at night when in bed pain is severe. She states that as she moves around it is more moderate. She is not having numbness or bowel or bladder issues. She denies fever.  She has been treating her symptoms with Ibuprofen and meloxicam she had for another issue. She states they are not working. She says in the past she was given prednisone which really relieved the pain and problem. Appointment scheduled per protocol.  Care advice read to patient.  Patient verbalized understanding.  Reason for Disposition . [1] Pain radiates into the thigh or further down the leg AND [2] one leg  Answer Assessment - Initial Assessment Questions 1. ONSET: "When did the pain begin?"      2weeks ago 2. LOCATION: "Where does it hurt?" (upper, mid or lower back)     Rt lower back to foot 3. SEVERITY: "How bad is the pain?"  (e.g., Scale 1-10; mild, moderate, or severe)   - MILD (1-3): doesn't interfere with normal activities    - MODERATE (4-7): interferes with normal activities or awakens from sleep    - SEVERE (8-10): excruciating pain, unable to do any normal activities      Severe at night but after moving moderate 4. PATTERN: "Is the pain constant?" (e.g., yes, no; constant, intermittent)      constant 5. RADIATION: "Does the pain shoot into your legs or elsewhere?"     Rt leg into foot 6. CAUSE:  "What do you think is causing the back pain?"      sciatia problem 7. BACK OVERUSE:  "Any recent lifting of heavy objects, strenuous work or exercise?"     no 8. MEDICATIONS: "What have you taken so far for the pain?" (e.g., nothing, acetaminophen, NSAIDS)     Ibuprfen Meloxicam 9. NEUROLOGIC SYMPTOMS: "Do you have any weakness, numbness, or problems with bowel/bladder control?"     no 10. OTHER SYMPTOMS: "Do you have any other symptoms?" (e.g., fever, abdominal pain, burning with urination, blood in urine)        no 11. PREGNANCY: "Is there any chance you are pregnant?" (e.g., yes, no; LMP)       N/A  Protocols used: BACK PAIN-A-AH

## 2017-12-26 ENCOUNTER — Encounter: Payer: Self-pay | Admitting: Family Medicine

## 2017-12-26 ENCOUNTER — Ambulatory Visit (INDEPENDENT_AMBULATORY_CARE_PROVIDER_SITE_OTHER): Payer: Medicare HMO | Admitting: Family Medicine

## 2017-12-26 VITALS — BP 132/78 | HR 82 | Temp 97.9°F | Resp 16 | Ht 62.5 in | Wt 139.0 lb

## 2017-12-26 DIAGNOSIS — M5416 Radiculopathy, lumbar region: Secondary | ICD-10-CM

## 2017-12-26 DIAGNOSIS — M47819 Spondylosis without myelopathy or radiculopathy, site unspecified: Secondary | ICD-10-CM

## 2017-12-26 DIAGNOSIS — E1142 Type 2 diabetes mellitus with diabetic polyneuropathy: Secondary | ICD-10-CM

## 2017-12-26 MED ORDER — PREDNISONE 10 MG (48) PO TBPK: ORAL_TABLET | ORAL | 0 refills | Status: DC

## 2017-12-26 MED ORDER — TRAMADOL HCL 50 MG PO TABS: 50.0000 mg | ORAL_TABLET | Freq: Three times a day (TID) | ORAL | 0 refills | Status: DC | PRN

## 2017-12-26 NOTE — Progress Notes (Signed)
Name: Brooke Randall   MRN: [id number]    DOB: 06-01-1950   Date:12/26/2017       Progress Note  Subjective  Chief Complaint  Chief Complaint  Patient presents with  . Back Pain    Onset-1 month, had a occurence of this 5 years, then the second occurence with Dr. Milas Kocher MRI in [place] that showed Bulging Disk L4 and 5- given Epidurals shots and Prednisone which both    HPI  Low back pain with radiculitis: she has a history of radiculitis in the past. She states first episode about 5 years ago and had at least two more episodes since. She responded to prednisone taper in the past. She states had MRI that showed herniated disc. Pain is described as dull on lumbar spine, but radiates down right lateral leg and is described as shooting like. It started about one month ago and is not getting better, affecting her sleep and she decided to come in for evaluation. No bowel or bladder incontinence. Denies fever or chills  DMII: glucose not being checked at home, explained that prednisone will cause glucose elevation and needs to keep a track and also avoid any form of sugar on her diet for the next 5 days    Patient Active Problem List   Diagnosis Date Noted  . Injury of tendon of long head of left biceps 10/05/2017  . Nontraumatic incomplete tear of left rotator cuff 10/05/2017  . Rotator cuff tendinitis, left 10/05/2017  . Degenerative tear of glenoid labrum of left shoulder 10/05/2017  . Arthropathy of spinal facet joint 06/05/2017  . Osteoarthritis of knee 06/05/2017  . Diabetic retinopathy associated with controlled type 2 diabetes mellitus ([place]) 06/05/2017  . Current moderate episode of major depressive disorder without prior episode ([place]) 05/14/2017  . Impingement syndrome of left shoulder region 03/22/2017  . Mass of right hand 04/21/2016  . Post-menopausal 03/31/2016  . Hyperlipidemia 12/17/2014  . Well controlled type 2 diabetes mellitus with peripheral neuropathy ([place]) 12/17/2014  .  Hypertension 08/20/2014  . Vitamin D deficiency 08/20/2014  . Lumbar disc herniation with radiculopathy 09/12/2012    Past Surgical History:  Procedure Laterality Date  . FOOT SURGERY Right 1999    Family History  Problem Relation Age of Onset  . Hypertension Mother   . Hypertension Father   . Diabetes Father   . Hypertension Sister   . Diabetes Sister   . Hypertension Brother     Social History   Socioeconomic History  . Marital status: Divorced    Spouse name: Not on file  . Number of children: 2  . Years of education: Not on file  . Highest education level: Master's degree (e.g., MA, MS, MEng, MEd, MSW, MBA)  Occupational History  . Occupation: Unemployed    Comment: Looking for work  Social Needs  . Financial resource strain: Not hard at all  . Food insecurity:    Worry: Never true    Inability: Never true  . Transportation needs:    Medical: No    Non-medical: No  Tobacco Use  . Smoking status: Never Smoker  . Smokeless tobacco: Never Used  Substance and Sexual Activity  . Alcohol use: No    Alcohol/week: 0.0 standard drinks  . Drug use: No  . Sexual activity: Not Currently    Birth control/protection: Post-menopausal  Lifestyle  . Physical activity:    Days per week: 0 days    Minutes per session: 0 min  . Stress: Not  at all  Relationships  . Social connections:    Talks on phone: Patient refused    Gets together: Patient refused    Attends religious service: Patient refused    Active member of club or organization: Patient refused    Attends meetings of clubs or organizations: Patient refused    Relationship status: Divorced  . Intimate partner violence:    Fear of current or ex partner: No    Emotionally abused: No    Physically abused: No    Forced sexual activity: No  Other Topics Concern  . Not on file  Social History Narrative   Retired, empty nested. No family around. She is Muslim     Current Outpatient Medications:  .  Calcium  Carbonate-Vit D-Min (CALCIUM/VITAMIN D/MINERALS) 600-200 MG-UNIT TABS, Take 1 tablet by mouth daily., Disp: , Rfl:  .  DULoxetine (CYMBALTA) 60 MG capsule, Take 1 capsule (60 mg total) by mouth daily., Disp: 90 capsule, Rfl: 1 .  ezetimibe (ZETIA) 10 MG tablet, Take 1 tablet (10 mg total) by mouth daily., Disp: 90 tablet, Rfl: 3 .  glipiZIDE (GLUCOTROL XL) 5 MG 24 hr tablet, Take 1 tablet (5 mg total) by mouth daily with breakfast., Disp: 90 tablet, Rfl: 1 .  glucose blood (ONE TOUCH ULTRA TEST) test strip, BID, Disp: 100 each, Rfl: 12 .  Lancets (ONETOUCH ULTRASOFT) lancets, Use as instructed, Disp: 100 each, Rfl: 12 .  metFORMIN (GLUCOPHAGE-XR) 750 MG 24 hr tablet, Take 2 tablets (1,500 mg total) by mouth daily with breakfast., Disp: 180 tablet, Rfl: 1 .  nortriptyline (PAMELOR) 25 MG capsule, Take 1 capsule (25 mg total) by mouth at bedtime., Disp: 90 capsule, Rfl: 0 .  olmesartan-hydrochlorothiazide (BENICAR HCT) 40-25 MG tablet, Take 0.5 tablets by mouth daily., Disp: 45 tablet, Rfl: 0 .  temazepam (RESTORIL) 15 MG capsule, Take 1 capsule (15 mg total) by mouth at bedtime as needed for sleep., Disp: 30 capsule, Rfl: 1  Allergies  Allergen Reactions  . Statins     Muscle aches    I personally reviewed active problem list, medication list, allergies, family history with the patient/caregiver today.   ROS  Ten systems reviewed and is negative except as mentioned in HPI   Objective  Vitals:   12/26/17 1422  BP: 132/78  Pulse: 82  Resp: 16  Temp: 97.9 F (36.6 C)  TempSrc: Oral  SpO2: 98%  Weight: 139 lb (63 kg)    Body mass index is 25.42 kg/m.  Physical Exam  Constitutional: Patient appears well-developed and well-nourished.No distress.  HEENT: head atraumatic, normocephalic, pupils equal and reactive to light,, neck supple, throat within normal limits Cardiovascular: Normal rate, regular rhythm and normal heart sounds.  No murmur heard. No BLE edema. Pulmonary/Chest:  Effort normal and breath sounds normal. No respiratory distress. Abdominal: Soft.  There is no tenderness. Muscular Skeletal: positive for pain on lumbar spine, negative straight leg raise, but pain is aggravated with right lateral bending and extension of lumbar spine  Psychiatric: Patient has a normal mood and affect. behavior is normal. Judgment and thought content normal.  Recent Results (from the past 2160 hour(s))  COMPLETE METABOLIC PANEL WITH GFR     Status: Abnormal   Collection Time: 10/26/17  3:07 PM  Result Value Ref Range   Glucose, Bld 193 (H) 65 - 99 mg/dL    Comment: .            Fasting reference interval . For someone without known diabetes,  a glucose value >125 mg/dL indicates that they may have diabetes and this should be confirmed with a follow-up test. .    BUN 10 7 - 25 mg/dL   Creat 0.91 0.50 - 0.99 mg/dL    Comment: For patients >69 years of age, the reference limit for Creatinine is approximately 13% higher for people identified as African-American. .    GFR, Est Non African American 66 > OR = 60 mL/min/1.20m2   GFR, Est African American 76 > OR = 60 mL/min/1.48m2   BUN/Creatinine Ratio NOT APPLICABLE 6 - 22 (calc)   Sodium 135 135 - 146 mmol/L   Potassium 3.9 3.5 - 5.3 mmol/L   Chloride 101 98 - 110 mmol/L   CO2 24 20 - 32 mmol/L   Calcium 9.8 8.6 - 10.4 mg/dL   Total Protein 6.6 6.1 - 8.1 g/dL   Albumin 4.1 3.6 - 5.1 g/dL   Globulin 2.5 1.9 - 3.7 g/dL (calc)   AG Ratio 1.6 1.0 - 2.5 (calc)   Total Bilirubin 0.8 0.2 - 1.2 mg/dL   Alkaline phosphatase (APISO) 67 33 - 130 U/L   AST 17 10 - 35 U/L   ALT 19 6 - 29 U/L  CBC with Differential/Platelet     Status: None   Collection Time: 10/26/17  3:07 PM  Result Value Ref Range   WBC 5.2 3.8 - 10.8 Thousand/uL   RBC 4.78 3.80 - 5.10 Million/uL   Hemoglobin 13.6 11.7 - 15.5 g/dL   HCT 40.1 35.0 - 45.0 %   MCV 83.9 80.0 - 100.0 fL   MCH 28.5 27.0 - 33.0 pg   MCHC 33.9 32.0 - 36.0 g/dL   RDW 12.8  11.0 - 15.0 %   Platelets 306 140 - 400 Thousand/uL   MPV 11.1 7.5 - 12.5 fL   Neutro Abs 3,162 1,500 - 7,800 cells/uL   Lymphs Abs 1,383 850 - 3,900 cells/uL   WBC mixed population 442 200 - 950 cells/uL   Eosinophils Absolute 172 15 - 500 cells/uL   Basophils Absolute 42 0 - 200 cells/uL   Neutrophils Relative % 60.8 %   Total Lymphocyte 26.6 %   Monocytes Relative 8.5 %   Eosinophils Relative 3.3 %   Basophils Relative 0.8 %  Hemoglobin A1c     Status: Abnormal   Collection Time: 10/26/17  3:07 PM  Result Value Ref Range   Hgb A1c MFr Bld 7.0 (H) <5.7 % of total Hgb    Comment: For someone without known diabetes, a hemoglobin A1c value of 6.5% or greater indicates that they may have  diabetes and this should be confirmed with a follow-up  test. . For someone with known diabetes, a value <7% indicates  that their diabetes is well controlled and a value  greater than or equal to 7% indicates suboptimal  control. A1c targets should be individualized based on  duration of diabetes, age, comorbid conditions, and  other considerations. . Currently, no consensus exists regarding use of hemoglobin A1c for diagnosis of diabetes for children. .    Mean Plasma Glucose 154 (calc)   eAG (mmol/L) 8.5 (calc)  Lipid panel     Status: Abnormal   Collection Time: 10/26/17  3:07 PM  Result Value Ref Range   Cholesterol 192 <200 mg/dL   HDL 36 (L) >50 mg/dL   Triglycerides 328 (H) <150 mg/dL    Comment: . If a non-fasting specimen was collected, consider repeat triglyceride testing on a fasting specimen  if clinically indicated.  Yates Decamp et al. J. of Clin. Lipidol. 3662;9:476-546. Marland Kitchen    LDL Cholesterol (Calc) 112 (H) mg/dL (calc)    Comment: Reference range: <100 . Desirable range <100 mg/dL for primary prevention;   <70 mg/dL for patients with CHD or diabetic patients  with > or = 2 CHD risk factors. Marland Kitchen LDL-C is now calculated using the Martin-Hopkins  calculation, which is a  validated novel method providing  better accuracy than the Friedewald equation in the  estimation of LDL-C.  Cresenciano Genre et al. Annamaria Helling. 5035;465(68): 2061-2068  (http://education.[facility].com/faq/FAQ164)    Total CHOL/HDL Ratio 5.3 (H) <5.0 (calc)   Non-HDL Cholesterol (Calc) 156 (H) <130 mg/dL (calc)    Comment: For patients with diabetes plus 1 major ASCVD risk  factor, treating to a non-HDL-C goal of <100 mg/dL  (LDL-C of <70 mg/dL) is considered a therapeutic  option.   CULTURE, URINE COMPREHENSIVE     Status: None   Collection Time: 10/26/17  3:07 PM  Result Value Ref Range   MICRO NUMBER: [id number]    SPECIMEN QUALITY: ADEQUATE    Source OTHER (SPECIFY)    STATUS: FINAL    RESULT: No Growth      PHQ2/9: Depression screen [facility] 2/9 10/26/2017 07/26/2017 05/07/2017 05/07/2017 02/27/2017  Decreased Interest 0 1 1 0 0  Down, Depressed, Hopeless 0 1 2 0 0  PHQ - 2 Score 0 2 3 0 0  Altered sleeping 0 1 2 - -  Tired, decreased energy 1 2 3  - -  Change in appetite 0 1 1 - -  Feeling bad or failure about yourself  0 0 0 - -  Trouble concentrating 0 0 1 - -  Moving slowly or fidgety/restless 0 0 0 - -  Suicidal thoughts 0 0 0 - -  PHQ-9 Score 1 6 10  - -  Difficult doing work/chores Somewhat difficult - Somewhat difficult - -     Fall Risk: Fall Risk  12/26/2017 10/26/2017 07/26/2017 07/26/2017 05/07/2017  Falls in the past year? 0 No No No Yes  Number falls in past yr: 0 - - - 1  Injury with Fall? 0 - - - No    Functional Status Survey: Is the patient deaf or have difficulty hearing?: No Does the patient have difficulty seeing, even when wearing glasses/contacts?: No Does the patient have difficulty concentrating, remembering, or making decisions?: No Does the patient have difficulty walking or climbing stairs?: No Does the patient have difficulty dressing or bathing?: No Does the patient have difficulty doing errands alone such as visiting a doctor's office or shopping?:  No    Assessment & Plan  1. Arthropathy of spinal facet joint   2. Well controlled type 2 diabetes mellitus with peripheral neuropathy ([place])   3. Lumbar back pain with radiculopathy affecting right lower extremity  - predniSONE (STERAPRED UNI-PAK 48 TAB) 10 MG (48) TBPK tablet; Take as directed  Dispense: 48 tablet; Refill: 0 - traMADol (ULTRAM) 50 MG tablet; Take 1 tablet (50 mg total) by mouth every 8 (eight) hours as needed.  Dispense: 30 tablet; Refill: 0

## 2018-02-11 ENCOUNTER — Telehealth: Payer: Self-pay | Admitting: Family Medicine

## 2018-02-11 DIAGNOSIS — Z0100 Encounter for examination of eyes and vision without abnormal findings: Secondary | ICD-10-CM | POA: Diagnosis not present

## 2018-02-11 NOTE — Telephone Encounter (Signed)
Copied from [place] [phone]. Topic: General - Other >> Feb 11, 2018  2:06 PM Lennox Solders wrote: Reason for CRM:pt saw dr Ancil Boozer on 12/26/17 for back pain with radiculitis . Pt was prescribed tramadol 50 mg. Pt would like try something stronger than tramadol 50 mg or another pain medication. [facility] on [address] in [place]

## 2018-02-12 NOTE — Telephone Encounter (Signed)
Patient called.  Patient aware.  

## 2018-02-12 NOTE — Telephone Encounter (Signed)
We will need to refer her to ortho or pain clinic. We don't treat chronic pain in our office

## 2018-02-15 DIAGNOSIS — M5416 Radiculopathy, lumbar region: Secondary | ICD-10-CM | POA: Diagnosis not present

## 2018-02-25 ENCOUNTER — Ambulatory Visit: Payer: Self-pay

## 2018-02-25 NOTE — Telephone Encounter (Signed)
Reported she has had elevated blood sugars.  Reported she saw an orthopedic MD last week for an epidural injection, and her blood sugar was 337, at approx. 11:00 AM 02/21/18.  Blood sugar fasting on 1/12: 237; BS at 9:30 PM 1/12: 198.  Has not checked blood sugar today.  Reported she had nausea and vomiting on Thurs.-Sat., after taking Hydrocodone for sciatic pain.  Stated she continues to feel some nausea. No further vomiting.  C/o increased thirst, and general feeling of weakness.  Stated she feels her urinary frequency is her baseline with taking current BP medications.  Reported she is on way to airport at present, and flying to [place] this evening.  Asking for further recommendations to manage her blood sugar.  Has not checked blood sugar since last evening, and last dose of Glipizide and Metformin was at approx. 10:30 AM today.   Called office; spoke with Dr. Ancil Boozer.  Given the situation, Dr. Ancil Boozer recommended to instruct pt. to drink a lot of water, and avoid starchy foods, such as bread, pastas, rice, etc.  Limit foods to vegetables and meats, during her flight.  Seek treatment at an Urgent Care, when she arrives at her destination.  Pt. was advised of the above recommendations per Dr. Ancil Boozer.  Verb. Understanding and agrees with plan.          Reason for Disposition . [1] Blood glucose > 300 mg/dL (16.7 mmol/L) AND [2] two or more times in a row  Answer Assessment - Initial Assessment Questions 1. BLOOD GLUCOSE: "What is your blood glucose level?"      237 fasting 1/12; 198 @ 9:30 PM.;  At Ortho clinic 1/9 @ 11:00 AM, BS was 337 2. ONSET: "When did you check the blood glucose?"     See above  3. USUAL RANGE: "What is your glucose level usually?" (e.g., usual fasting morning value, usual evening value)     Hasn't been checking this over the holidays;  4. KETONES: "Do you check for ketones (urine or blood test strips)?" If yes, ask: "What does the test show now?"      *No Answer* 5. TYPE 1 or 2:   "Do you know what type of diabetes you have?"  (e.g., Type 1, Type 2, Gestational; doesn't know)      Type 2 6. INSULIN: "Do you take insulin?" "What type of insulin(s) do you use? What is the mode of delivery? (syringe, pen (e.g., injection or  pump)?"      No  7. DIABETES PILLS: "Do you take any pills for your diabetes?" If yes, ask: "Have you missed taking any pills recently?"    Takes Glypizide and Metformin; has not missed doses 8. OTHER SYMPTOMS: "Do you have any symptoms?" (e.g., fever, frequent urination, difficulty breathing, dizziness, weakness, vomiting)    Took Hydrocodone last week; vomited Thurs, Friday, and Saturday; was unsure if she this is related to the medication or the blood sugar; denied freq. urination, c/o increased thirst; c/o generalized weakness.; denied dizziness or shortness of breath  9. PREGNANCY: "Is there any chance you are pregnant?" "When was your last menstrual period?"     N/a  Protocols used: DIABETES - HIGH BLOOD SUGAR-A-AH

## 2018-02-28 ENCOUNTER — Ambulatory Visit: Payer: Medicare HMO

## 2018-04-05 ENCOUNTER — Ambulatory Visit: Payer: Self-pay | Admitting: Nurse Practitioner

## 2018-04-05 ENCOUNTER — Ambulatory Visit: Payer: Self-pay | Admitting: *Deleted

## 2018-04-05 NOTE — Telephone Encounter (Signed)
Patient called in and stated she is having burning while urinating and frequent urination; she states she is in to much pain to drive and wants to know what she can do/ and also wants to know if her bp meds are ok to take or will it make it worse  Call to office- patient just got back 4 days ago from trip to [place]- she is reporting UTI symptoms. Patient encouraged to come to office for appointment- she states she can come this afternoon- appointment scheduled.  Reason for Disposition . [1] SEVERE pain with urination  (e.g., excruciating) AND [2] not improved after 2 hours of pain medicine and Sitz bath  Answer Assessment - Initial Assessment Questions 1. SYMPTOM: "What's the main symptom you're concerned about?" (e.g., frequency, incontinence)     Painful urination, not emptying completely 2. ONSET: "When did the  symptoms  start?"     2 days ago- Wednesday morning 3. PAIN: "Is there any pain?" If so, ask: "How bad is it?" (Scale: 1-10; mild, moderate, severe)     Pain and burning- 8-9 4. CAUSE: "What do you think is causing the symptoms?"     UTI 5. OTHER SYMPTOMS: "Do you have any other symptoms?" (e.g., fever, flank pain, blood in urine, pain with urination)     Pain with urination 6. PREGNANCY: "Is there any chance you are pregnant?" "When was your last menstrual period?"     n/a  Protocols used: [place], URINARY [facility]

## 2018-04-08 ENCOUNTER — Encounter: Payer: Self-pay | Admitting: Nurse Practitioner

## 2018-04-08 ENCOUNTER — Other Ambulatory Visit
Admission: RE | Admit: 2018-04-08 | Discharge: 2018-04-08 | Disposition: A | Discharge status: Home / Self Care | Payer: Medicare HMO | Source: Ambulatory Visit | Attending: Nurse Practitioner | Admitting: Nurse Practitioner

## 2018-04-08 ENCOUNTER — Ambulatory Visit (INDEPENDENT_AMBULATORY_CARE_PROVIDER_SITE_OTHER): Payer: Medicare HMO | Admitting: Nurse Practitioner

## 2018-04-08 VITALS — BP 142/80 | HR 98 | Temp 97.5°F | Resp 16 | Ht 63.0 in | Wt 131.7 lb

## 2018-04-08 DIAGNOSIS — R399 Unspecified symptoms and signs involving the genitourinary system: Secondary | ICD-10-CM | POA: Diagnosis not present

## 2018-04-08 DIAGNOSIS — E1142 Type 2 diabetes mellitus with diabetic polyneuropathy: Secondary | ICD-10-CM | POA: Diagnosis not present

## 2018-04-08 DIAGNOSIS — R309 Painful micturition, unspecified: Secondary | ICD-10-CM | POA: Insufficient documentation

## 2018-04-08 DIAGNOSIS — Z882 Allergy status to sulfonamides status: Secondary | ICD-10-CM | POA: Insufficient documentation

## 2018-04-08 DIAGNOSIS — E11319 Type 2 diabetes mellitus with unspecified diabetic retinopathy without macular edema: Secondary | ICD-10-CM | POA: Diagnosis not present

## 2018-04-08 DIAGNOSIS — Z888 Allergy status to other drugs, medicaments and biological substances status: Secondary | ICD-10-CM | POA: Diagnosis not present

## 2018-04-08 DIAGNOSIS — I10 Essential (primary) hypertension: Secondary | ICD-10-CM

## 2018-04-08 DIAGNOSIS — Z7984 Long term (current) use of oral hypoglycemic drugs: Secondary | ICD-10-CM | POA: Diagnosis not present

## 2018-04-08 DIAGNOSIS — Z79899 Other long term (current) drug therapy: Secondary | ICD-10-CM | POA: Diagnosis not present

## 2018-04-08 DIAGNOSIS — E782 Mixed hyperlipidemia: Secondary | ICD-10-CM | POA: Insufficient documentation

## 2018-04-08 DIAGNOSIS — R69 Illness, unspecified: Secondary | ICD-10-CM | POA: Diagnosis not present

## 2018-04-08 DIAGNOSIS — F321 Major depressive disorder, single episode, moderate: Secondary | ICD-10-CM

## 2018-04-08 DIAGNOSIS — R350 Frequency of micturition: Secondary | ICD-10-CM | POA: Insufficient documentation

## 2018-04-08 DIAGNOSIS — R112 Nausea with vomiting, unspecified: Secondary | ICD-10-CM | POA: Diagnosis not present

## 2018-04-08 LAB — CBC WITH DIFFERENTIAL/PLATELET
Abs Immature Granulocytes: 0.02 10*3/uL (ref 0.00–0.07)
Basophils Absolute: 0.1 10*3/uL (ref 0.0–0.1)
Basophils Relative: 1 %
Eosinophils Absolute: 0.2 10*3/uL (ref 0.0–0.5)
Eosinophils Relative: 2 %
HCT: 40 % (ref 36.0–46.0)
Hemoglobin: 13.1 g/dL (ref 12.0–15.0)
Immature Granulocytes: 0 %
Lymphocytes Relative: 29 %
Lymphs Abs: 2.7 10*3/uL (ref 0.7–4.0)
MCH: 27.9 pg (ref 26.0–34.0)
MCHC: 32.8 g/dL (ref 30.0–36.0)
MCV: 85.1 fL (ref 80.0–100.0)
Monocytes Absolute: 0.7 10*3/uL (ref 0.1–1.0)
Monocytes Relative: 8 %
NRBC: 0 % (ref 0.0–0.2)
Neutro Abs: 5.7 10*3/uL (ref 1.7–7.7)
Neutrophils Relative %: 60 %
Platelets: 364 10*3/uL (ref 150–400)
RBC: 4.7 MIL/uL (ref 3.87–5.11)
RDW: 12.6 % (ref 11.5–15.5)
WBC: 9.2 10*3/uL (ref 4.0–10.5)

## 2018-04-08 LAB — LIPID PANEL
Cholesterol: 202 mg/dL — ABNORMAL HIGH (ref 0–200)
HDL: 38 mg/dL — ABNORMAL LOW (ref >40)
LDL Cholesterol: 118 mg/dL — ABNORMAL HIGH (ref 0–99)
Total CHOL/HDL Ratio: 5.3 RATIO
Triglycerides: 232 mg/dL — ABNORMAL HIGH (ref <150)
VLDL: 46 mg/dL — ABNORMAL HIGH (ref 0–40)

## 2018-04-08 LAB — COMPREHENSIVE METABOLIC PANEL
ALT: 17 U/L (ref 0–44)
AST: 19 U/L (ref 15–41)
Albumin: 4.1 g/dL (ref 3.5–5.0)
Alkaline Phosphatase: 77 U/L (ref 38–126)
Anion gap: 11 (ref 5–15)
BILIRUBIN TOTAL: 1.3 mg/dL — AB (ref 0.3–1.2)
BUN: 20 mg/dL (ref 8–23)
CO2: 21 mmol/L — ABNORMAL LOW (ref 22–32)
Calcium: 9.1 mg/dL (ref 8.9–10.3)
Chloride: 97 mmol/L — ABNORMAL LOW (ref 98–111)
Creatinine, Ser: 0.91 mg/dL (ref 0.44–1.00)
GFR calc Af Amer: >60 mL/min (ref >60)
GFR calc non Af Amer: >60 mL/min (ref >60)
Glucose, Bld: 226 mg/dL — ABNORMAL HIGH (ref 70–99)
POTASSIUM: 2.8 mmol/L — AB (ref 3.5–5.1)
Sodium: 129 mmol/L — ABNORMAL LOW (ref 135–145)
Total Protein: 7.1 g/dL (ref 6.5–8.1)

## 2018-04-08 LAB — URINALYSIS, ROUTINE W REFLEX MICROSCOPIC
Bilirubin Urine: NEGATIVE
Glucose, UA: NEGATIVE mg/dL
Ketones, ur: NEGATIVE mg/dL
Nitrite: POSITIVE — AB
Protein, ur: 30 mg/dL — AB
Specific Gravity, Urine: 1.008 (ref 1.005–1.030)
WBC, UA: >50 WBC/hpf — ABNORMAL HIGH (ref 0–5)
pH: 6 (ref 5.0–8.0)

## 2018-04-08 LAB — POCT GLYCOSYLATED HEMOGLOBIN (HGB A1C): HEMOGLOBIN A1C: 7.9 % — AB (ref 4.0–5.6)

## 2018-04-08 MED ORDER — ONDANSETRON 4 MG PO TBDP: 4.0000 mg | ORAL_TABLET | Freq: Three times a day (TID) | ORAL | 0 refills | Status: DC | PRN

## 2018-04-08 MED ORDER — METFORMIN HCL ER 500 MG PO TB24: 2000.0000 mg | ORAL_TABLET | Freq: Every day | ORAL | 2 refills | Status: DC

## 2018-04-08 NOTE — Progress Notes (Signed)
Name: Brooke Randall   MRN: [id number]    DOB: Jun 23, 1950   Date:04/08/2018       Progress Note  Subjective  Chief Complaint  Chief Complaint  Patient presents with  . Urinary Tract Infection    patient presents with burning upon urination and pain. sx started last wednesday. otc: AZO  . Urinary Frequency    HPI  UTI symptoms: patient endorses dysuria and urinary frequency and foul odor started last Wednesday but was unable to come. Endorses lower back pain. Denies vaginal discharge. Started taking azo that has resolved pain. States was having nausea the past 2 days and had at least 5 episodes of vomiting yesterday but states resolved last night. Endorses mild nausea. Denies abdominal pain, diarrhea, fevers, chills.   Hypertension: patient taking half tablet of benicar 40/25 daily. BP Readings from Last 3 Encounters:  04/08/18 (!) 142/80  12/26/17 132/78  10/26/17 114/66   DM2: takes metformin 750mg  BID and glipizide 5mg  daily. On ARB for nephroprotection. Stopped gabapentin for neuropathy due to drowsiness. Patient has had some cortisone shots the past few months. States CBG in January was 300's so they didn't give her a cortisone shot. Patient states a couple of weeks later went back down to mid 100's. States the last few days fasting have been between 245-305.  Lab Results  Component Value Date   HGBA1C 7.9 (A) 04/08/2018    Hyperlipidemia: states she is statin intolerant;states was taking zetia but stopped taking it because she was tired of taking it- has been off for maybe 3 months.  Lab Results  Component Value Date   CHOL 192 10/26/2017   HDL 36 (L) 10/26/2017   LDLCALC 112 (H) 10/26/2017   TRIG 328 (H) 10/26/2017   CHOLHDL 5.3 (H) 10/26/2017    MDD: takes cymbalta 60mg  daily, well controlled.  Depression screen [facility] 2/9 04/08/2018 10/26/2017 07/26/2017 05/07/2017 05/07/2017  Decreased Interest 0 0 1 1 0  Down, Depressed, Hopeless 1 0 1 2 0  PHQ - 2 Score 1 0 2 3 0  Altered  sleeping - 0 1 2 -  Tired, decreased energy - 1 2 3  -  Change in appetite - 0 1 1 -  Feeling bad or failure about yourself  - 0 0 0 -  Trouble concentrating - 0 0 1 -  Moving slowly or fidgety/restless - 0 0 0 -  Suicidal thoughts - 0 0 0 -  PHQ-9 Score - 1 6 10  -  Difficult doing work/chores - Somewhat difficult - Somewhat difficult -     Patient Active Problem List   Diagnosis Date Noted  . Injury of tendon of long head of left biceps 10/05/2017  . Nontraumatic incomplete tear of left rotator cuff 10/05/2017  . Rotator cuff tendinitis, left 10/05/2017  . Degenerative tear of glenoid labrum of left shoulder 10/05/2017  . Arthropathy of spinal facet joint 06/05/2017  . Osteoarthritis of knee 06/05/2017  . Diabetic retinopathy associated with controlled type 2 diabetes mellitus ([place]) 06/05/2017  . Current moderate episode of major depressive disorder without prior episode ([place]) 05/14/2017  . Impingement syndrome of left shoulder region 03/22/2017  . Mass of right hand 04/21/2016  . Post-menopausal 03/31/2016  . Hyperlipidemia 12/17/2014  . Well controlled type 2 diabetes mellitus with peripheral neuropathy ([place]) 12/17/2014  . Hypertension 08/20/2014  . Vitamin D deficiency 08/20/2014  . Lumbar disc herniation with radiculopathy 09/12/2012    Past Medical History:  Diagnosis Date  . Arthritis   .  Diabetes mellitus without complication ([place])   . Hypertension   . Osteoporosis     Past Surgical History:  Procedure Laterality Date  . FOOT SURGERY Right 1999    Social History   Tobacco Use  . Smoking status: Never Smoker  . Smokeless tobacco: Never Used  Substance Use Topics  . Alcohol use: No    Alcohol/week: 0.0 standard drinks     Current Outpatient Medications:  .  Calcium Carbonate-Vit D-Min (CALCIUM/VITAMIN D/MINERALS) 600-200 MG-UNIT TABS, Take 1 tablet by mouth daily., Disp: , Rfl:  .  DULoxetine (CYMBALTA) 60 MG capsule, Take 1 capsule (60 mg total) by  mouth daily., Disp: 90 capsule, Rfl: 1 .  ezetimibe (ZETIA) 10 MG tablet, Take 1 tablet (10 mg total) by mouth daily., Disp: 90 tablet, Rfl: 3 .  glipiZIDE (GLUCOTROL XL) 5 MG 24 hr tablet, Take 1 tablet (5 mg total) by mouth daily with breakfast., Disp: 90 tablet, Rfl: 1 .  glucose blood (ONE TOUCH ULTRA TEST) test strip, BID, Disp: 100 each, Rfl: 12 .  Lancets (ONETOUCH ULTRASOFT) lancets, Use as instructed, Disp: 100 each, Rfl: 12 .  metFORMIN (GLUCOPHAGE XR) 500 MG 24 hr tablet, Take 4 tablets (2,000 mg total) by mouth daily with breakfast., Disp: 120 tablet, Rfl: 2 .  nortriptyline (PAMELOR) 25 MG capsule, Take 1 capsule (25 mg total) by mouth at bedtime., Disp: 90 capsule, Rfl: 0 .  olmesartan-hydrochlorothiazide (BENICAR HCT) 40-25 MG tablet, Take 0.5 tablets by mouth daily., Disp: 45 tablet, Rfl: 0 .  ondansetron (ZOFRAN-ODT) 4 MG disintegrating tablet, Take 1 tablet (4 mg total) by mouth every 8 (eight) hours as needed for nausea or vomiting., Disp: 20 tablet, Rfl: 0 .  temazepam (RESTORIL) 15 MG capsule, Take 1 capsule (15 mg total) by mouth at bedtime as needed for sleep., Disp: 30 capsule, Rfl: 1 .  traMADol (ULTRAM) 50 MG tablet, Take 1 tablet (50 mg total) by mouth every 8 (eight) hours as needed., Disp: 30 tablet, Rfl: 0  Allergies  Allergen Reactions  . Sulfa Antibiotics   . Statins     Muscle aches    ROS   No other specific complaints in a complete review of systems (except as listed in HPI above).  Objective  Vitals:   04/08/18 1525  BP: (!) 142/80  Pulse: 98  Resp: 16  Temp: (!) 97.5 F (36.4 C)  TempSrc: Oral  SpO2: 96%  Weight: 131 lb 11.2 oz (59.7 kg)  Height: 5\' 3"  (1.6 m)    Body mass index is 23.33 kg/m.  Nursing Note and Vital Signs reviewed.  Physical Exam Vitals signs reviewed.  Constitutional:      Appearance: She is well-developed.  HENT:     Head: Normocephalic and atraumatic.  Neck:     Musculoskeletal: Normal range of motion and  neck supple.     Vascular: No carotid bruit.  Cardiovascular:     Heart sounds: Normal heart sounds.  Pulmonary:     Effort: Pulmonary effort is normal.     Breath sounds: Normal breath sounds.  Abdominal:     General: Bowel sounds are normal.     Palpations: Abdomen is soft.     Tenderness: There is no abdominal tenderness. There is no right CVA tenderness or left CVA tenderness.  Musculoskeletal: Normal range of motion.       Back:  Skin:    General: Skin is warm and dry.     Capillary Refill: Capillary refill takes less  than 2 seconds.  Neurological:     Mental Status: She is alert and oriented to person, place, and time.     GCS: GCS eye subscore is 4. GCS verbal subscore is 5. GCS motor subscore is 6.     Sensory: No sensory deficit.  Psychiatric:        Speech: Speech normal.        Behavior: Behavior normal.        Thought Content: Thought content normal.        Judgment: Judgment normal.       Results for orders placed or performed in visit on 04/08/18 (from the past 48 hour(s))  POCT HgB A1C     Status: Abnormal   Collection Time: 04/08/18  4:21 PM  Result Value Ref Range   Hemoglobin A1C 7.9 (A) 4.0 - 5.6 %   HbA1c POC (<> result, manual entry)     HbA1c, POC (prediabetic range)     HbA1c, POC (controlled diabetic range)      Assessment & Plan  1. UTI symptoms Consider pyelo with nausea and vomiting- patient appears will in office; has been taking azo so office dipstick will not read- requesting patient to go to medical mall to get stat labs to result sooner for proper care. Patient may also be having frequency due to hyperglycemia.  - Urinalysis, Routine w reflex microscopic; Future - Urine Culture  2. Mixed hyperlipidemia Stopped zetia; will recheck lipids and discuss with patient at one week follow-up  - Lipid Profile; Future  3. Well controlled type 2 diabetes mellitus with peripheral neuropathy (HCC) Elevated A1C discussed with PCP- discussed more  stringent diet will increase to max metformin. Patient will keep blood sugar log and re-discuss at follow up. Also likely elevation due to steroid shots and was recently on vacation in [place].  - POCT HgB A1C - metFORMIN (GLUCOPHAGE XR) 500 MG 24 hr tablet; Take 4 tablets (2,000 mg total) by mouth daily with breakfast.  Dispense: 120 tablet; Refill: 2  4. Essential hypertension Mildly above goal- on full dose of benicar was hypotensive, looser goals with geriatric population discussed DASH; recheck in one week.   5. Diabetic retinopathy associated with controlled type 2 diabetes mellitus ([place]) - Comprehensive Metabolic Panel (CMET); Future  6. Current moderate episode of major depressive disorder without prior episode ([place]) Controlled continue current therapies   7. Non-intractable vomiting with nausea, unspecified vomiting type Vomiting resolved, mild nausea still present. Discussed red flag symptoms/  - CBC with Differential; Future - Comprehensive Metabolic Panel (CMET); Future - ondansetron (ZOFRAN-ODT) 4 MG disintegrating tablet; Take 1 tablet (4 mg total) by mouth every 8 (eight) hours as needed for nausea or vomiting.  Dispense: 20 tablet; Refill: 0   -Red flags and when to present for emergency care or RTC including fever >101.55F, chest pain, shortness of breath, uncontrolled vomiting, severe fatigue,  new/worsening/un-resolving symptoms, reviewed with patient at time of visit. Follow up and care instructions discussed and provided in AVS.

## 2018-04-08 NOTE — Patient Instructions (Addendum)
-   Please go to the hospital to get labs and urine results back faster.  - Please do see your eye doctor regularly, and have your eyes examined every year (or more often per his or her recommendation) - Check your feet every night and let me know right away of any sores, infections, numbness, etc. Try to limit sweets, white bread, white rice, white potatoes Increase your metformin to a total of 2,000mg  daily. Do not take more than this. Continue glipizide 5mg  daily. Work on diet- eating more protein and fruits and vegetables and less starches and carbohydrates. Bring in log of blood sugars over the next week some fasting and some 1-2 hours after meals.   - if you develop severe pain, uncontrollable vomiting, severe weakness, fevers or chills please go to ER

## 2018-04-09 ENCOUNTER — Other Ambulatory Visit: Payer: Self-pay | Admitting: Nurse Practitioner

## 2018-04-09 ENCOUNTER — Other Ambulatory Visit
Admission: RE | Admit: 2018-04-09 | Discharge: 2018-04-09 | Disposition: A | Discharge status: Home / Self Care | Payer: Medicare HMO | Source: Ambulatory Visit | Attending: Nurse Practitioner | Admitting: Nurse Practitioner

## 2018-04-09 DIAGNOSIS — E871 Hypo-osmolality and hyponatremia: Secondary | ICD-10-CM

## 2018-04-09 DIAGNOSIS — E782 Mixed hyperlipidemia: Secondary | ICD-10-CM | POA: Diagnosis not present

## 2018-04-09 DIAGNOSIS — E11319 Type 2 diabetes mellitus with unspecified diabetic retinopathy without macular edema: Secondary | ICD-10-CM

## 2018-04-09 DIAGNOSIS — E876 Hypokalemia: Secondary | ICD-10-CM

## 2018-04-09 DIAGNOSIS — R112 Nausea with vomiting, unspecified: Secondary | ICD-10-CM

## 2018-04-09 DIAGNOSIS — R399 Unspecified symptoms and signs involving the genitourinary system: Secondary | ICD-10-CM

## 2018-04-09 DIAGNOSIS — N3000 Acute cystitis without hematuria: Secondary | ICD-10-CM

## 2018-04-09 LAB — COMPREHENSIVE METABOLIC PANEL
ALT: 16 U/L (ref 0–44)
AST: 22 U/L (ref 15–41)
Albumin: 3.8 g/dL (ref 3.5–5.0)
Alkaline Phosphatase: 76 U/L (ref 38–126)
Anion gap: 12 (ref 5–15)
BUN: 19 mg/dL (ref 8–23)
CO2: 20 mmol/L — ABNORMAL LOW (ref 22–32)
CREATININE: 0.94 mg/dL (ref 0.44–1.00)
Calcium: 9.1 mg/dL (ref 8.9–10.3)
Chloride: 99 mmol/L (ref 98–111)
GFR calc Af Amer: >60 mL/min (ref >60)
GFR calc non Af Amer: >60 mL/min (ref >60)
Glucose, Bld: 381 mg/dL — ABNORMAL HIGH (ref 70–99)
Potassium: 3.1 mmol/L — ABNORMAL LOW (ref 3.5–5.1)
Sodium: 131 mmol/L — ABNORMAL LOW (ref 135–145)
Total Bilirubin: 2.6 mg/dL — ABNORMAL HIGH (ref 0.3–1.2)
Total Protein: 7.1 g/dL (ref 6.5–8.1)

## 2018-04-09 LAB — CBC WITH DIFFERENTIAL/PLATELET
Abs Immature Granulocytes: 0.02 10*3/uL (ref 0.00–0.07)
Basophils Absolute: 0.1 10*3/uL (ref 0.0–0.1)
Basophils Relative: 1 %
EOS PCT: 2 %
Eosinophils Absolute: 0.2 10*3/uL (ref 0.0–0.5)
HCT: 40 % (ref 36.0–46.0)
Hemoglobin: 12.8 g/dL (ref 12.0–15.0)
Immature Granulocytes: 0 %
Lymphocytes Relative: 23 %
Lymphs Abs: 1.8 10*3/uL (ref 0.7–4.0)
MCH: 27.4 pg (ref 26.0–34.0)
MCHC: 32 g/dL (ref 30.0–36.0)
MCV: 85.7 fL (ref 80.0–100.0)
MONO ABS: 0.6 10*3/uL (ref 0.1–1.0)
Monocytes Relative: 8 %
Neutro Abs: 5.2 10*3/uL (ref 1.7–7.7)
Neutrophils Relative %: 66 %
Platelets: 352 10*3/uL (ref 150–400)
RBC: 4.67 MIL/uL (ref 3.87–5.11)
RDW: 12.8 % (ref 11.5–15.5)
WBC: 7.8 10*3/uL (ref 4.0–10.5)
nRBC: 0 % (ref 0.0–0.2)

## 2018-04-09 LAB — LIPID PANEL
Cholesterol: 189 mg/dL (ref 0–200)
HDL: 34 mg/dL — ABNORMAL LOW (ref >40)
LDL Cholesterol: 108 mg/dL — ABNORMAL HIGH (ref 0–99)
TRIGLYCERIDES: 233 mg/dL — AB (ref <150)
Total CHOL/HDL Ratio: 5.6 RATIO
VLDL: 47 mg/dL — ABNORMAL HIGH (ref 0–40)

## 2018-04-09 LAB — MAGNESIUM: Magnesium: 1.8 mg/dL (ref 1.7–2.4)

## 2018-04-09 MED ORDER — AMOXICILLIN-POT CLAVULANATE 875-125 MG PO TABS: 1.0000 | ORAL_TABLET | Freq: Two times a day (BID) | ORAL | 0 refills | Status: DC

## 2018-04-09 MED ORDER — POTASSIUM CHLORIDE CRYS ER 20 MEQ PO TBCR: 20.0000 meq | EXTENDED_RELEASE_TABLET | Freq: Two times a day (BID) | ORAL | 0 refills | Status: DC

## 2018-04-11 LAB — URINE CULTURE

## 2018-04-12 ENCOUNTER — Other Ambulatory Visit: Payer: Self-pay | Admitting: Nurse Practitioner

## 2018-04-12 ENCOUNTER — Ambulatory Visit (INDEPENDENT_AMBULATORY_CARE_PROVIDER_SITE_OTHER): Payer: Medicare HMO | Admitting: Nurse Practitioner

## 2018-04-12 ENCOUNTER — Other Ambulatory Visit
Admission: RE | Admit: 2018-04-12 | Discharge: 2018-04-12 | Disposition: A | Discharge status: Home / Self Care | Payer: Medicare HMO | Source: Ambulatory Visit | Attending: Nurse Practitioner | Admitting: Nurse Practitioner

## 2018-04-12 ENCOUNTER — Ambulatory Visit
Admission: RE | Admit: 2018-04-12 | Discharge: 2018-04-12 | Disposition: A | Discharge status: Home / Self Care | Payer: Medicare HMO | Source: Ambulatory Visit | Attending: Nurse Practitioner | Admitting: Nurse Practitioner

## 2018-04-12 ENCOUNTER — Encounter: Payer: Self-pay | Admitting: Nurse Practitioner

## 2018-04-12 VITALS — BP 130/72 | HR 97 | Temp 97.7°F | Resp 14 | Ht 63.0 in | Wt 134.5 lb

## 2018-04-12 DIAGNOSIS — R1011 Right upper quadrant pain: Secondary | ICD-10-CM

## 2018-04-12 DIAGNOSIS — E876 Hypokalemia: Secondary | ICD-10-CM | POA: Diagnosis not present

## 2018-04-12 DIAGNOSIS — D3001 Benign neoplasm of right kidney: Secondary | ICD-10-CM

## 2018-04-12 DIAGNOSIS — I10 Essential (primary) hypertension: Secondary | ICD-10-CM

## 2018-04-12 DIAGNOSIS — N3001 Acute cystitis with hematuria: Secondary | ICD-10-CM | POA: Diagnosis not present

## 2018-04-12 DIAGNOSIS — E871 Hypo-osmolality and hyponatremia: Secondary | ICD-10-CM | POA: Diagnosis not present

## 2018-04-12 DIAGNOSIS — R17 Unspecified jaundice: Secondary | ICD-10-CM

## 2018-04-12 DIAGNOSIS — R112 Nausea with vomiting, unspecified: Secondary | ICD-10-CM | POA: Diagnosis not present

## 2018-04-12 DIAGNOSIS — D1771 Benign lipomatous neoplasm of kidney: Secondary | ICD-10-CM

## 2018-04-12 DIAGNOSIS — K8020 Calculus of gallbladder without cholecystitis without obstruction: Secondary | ICD-10-CM | POA: Diagnosis not present

## 2018-04-12 DIAGNOSIS — K8010 Calculus of gallbladder with chronic cholecystitis without obstruction: Secondary | ICD-10-CM

## 2018-04-12 LAB — COMPREHENSIVE METABOLIC PANEL
ALT: 16 U/L (ref 0–44)
AST: 17 U/L (ref 15–41)
Albumin: 4 g/dL (ref 3.5–5.0)
Alkaline Phosphatase: 71 U/L (ref 38–126)
Anion gap: 9 (ref 5–15)
BUN: 14 mg/dL (ref 8–23)
CO2: 24 mmol/L (ref 22–32)
Calcium: 9.3 mg/dL (ref 8.9–10.3)
Chloride: 103 mmol/L (ref 98–111)
Creatinine, Ser: 0.93 mg/dL (ref 0.44–1.00)
GFR calc Af Amer: >60 mL/min (ref >60)
GFR calc non Af Amer: >60 mL/min (ref >60)
Glucose, Bld: 243 mg/dL — ABNORMAL HIGH (ref 70–99)
Potassium: 3.9 mmol/L (ref 3.5–5.1)
Sodium: 136 mmol/L (ref 135–145)
Total Bilirubin: 0.9 mg/dL (ref 0.3–1.2)
Total Protein: 7.2 g/dL (ref 6.5–8.1)

## 2018-04-12 LAB — BILIRUBIN, FRACTIONATED(TOT/DIR/INDIR): Total Bilirubin: 1 mg/dL (ref 0.3–1.2)

## 2018-04-12 MED ORDER — AMLODIPINE BESYLATE 5 MG PO TABS: 5.0000 mg | ORAL_TABLET | Freq: Every day | ORAL | 3 refills | Status: DC

## 2018-04-12 MED ORDER — OLMESARTAN MEDOXOMIL 40 MG PO TABS: 40.0000 mg | ORAL_TABLET | Freq: Every day | ORAL | 0 refills | Status: DC

## 2018-04-12 NOTE — Patient Instructions (Addendum)
-   Please go to the medical mall to get your blood work done.  - Please go to the outpatient imaging center on [address] to get your ultrasound it is scheduled for 3pm; please arrive at least 15 minutes before. No food, just drink plenty of water until then.  Rest, drink plenty of water, continue eating small frequent meals and bland snacks. Take your medications as prescribed - If you are not improved, have palpitations, muscle cramping, increased fatigue, vomiting, chest pain, for blood sugars over 350 or any new or concerning issues please go to the ER.

## 2018-04-12 NOTE — Progress Notes (Signed)
Name: Brooke Randall   MRN: [id number]    DOB: 16-Aug-1950   Date:04/12/2018       Progress Note  Subjective  Chief Complaint  Chief Complaint  Patient presents with  . Follow-up    patient stated that she has not improved. sx are the same    HPI  Patient presents for follow-up on UTI, hypokalemia, hyponatremia and gastroenteritis. Patient has been increasing sodium intake, taking potassium supplements as prescribed, hydrating well and taking antibiotic as prescribed. Patient endorses nausea in the medicine- resolves with zofran, states then comes again in the evening and resolves with zofran. States nausea does not feel like it is associated with taking antibiotics. No vomiting recently. Dysuria urinary frequency resolved. Her fatigue is unimproved- she has no energy to eat or get anything done, feels she cannot do anything but lay in bed all day. Still having RUQ pain- states feels a constant pain that shoots to back, not associated with eating. Last BM was 3 days ago. No fevers or chills, no rash, shortness of breath.   Diabetes Fasting sugars the last couple of days was 215-255.  She is now taking metformin 1500mg  daily and glipizide 10mg - went up from 5mg  daily.  Lab Results  Component Value Date   HGBA1C 7.9 (A) 04/08/2018   Wt Readings from Last 3 Encounters:  04/12/18 134 lb 8 oz (61 kg)  04/08/18 131 lb 11.2 oz (59.7 kg)  12/26/17 139 lb (63 kg)     Patient Active Problem List   Diagnosis Date Noted  . Injury of tendon of long head of left biceps 10/05/2017  . Nontraumatic incomplete tear of left rotator cuff 10/05/2017  . Rotator cuff tendinitis, left 10/05/2017  . Degenerative tear of glenoid labrum of left shoulder 10/05/2017  . Arthropathy of spinal facet joint 06/05/2017  . Osteoarthritis of knee 06/05/2017  . Diabetic retinopathy associated with controlled type 2 diabetes mellitus ([place]) 06/05/2017  . Current moderate episode of major depressive disorder without prior  episode ([place]) 05/14/2017  . Impingement syndrome of left shoulder region 03/22/2017  . Mass of right hand 04/21/2016  . Post-menopausal 03/31/2016  . Hyperlipidemia 12/17/2014  . Well controlled type 2 diabetes mellitus with peripheral neuropathy ([place]) 12/17/2014  . Hypertension 08/20/2014  . Vitamin D deficiency 08/20/2014  . Lumbar disc herniation with radiculopathy 09/12/2012    Past Medical History:  Diagnosis Date  . Arthritis   . Diabetes mellitus without complication ([place])   . Hypertension   . Osteoporosis     Past Surgical History:  Procedure Laterality Date  . FOOT SURGERY Right 1999    Social History   Tobacco Use  . Smoking status: Never Smoker  . Smokeless tobacco: Never Used  Substance Use Topics  . Alcohol use: No    Alcohol/week: 0.0 standard drinks     Current Outpatient Medications:  .  amoxicillin-clavulanate (AUGMENTIN) 875-125 MG tablet, Take 1 tablet by mouth 2 (two) times daily., Disp: 14 tablet, Rfl: 0 .  Calcium Carbonate-Vit D-Min (CALCIUM/VITAMIN D/MINERALS) 600-200 MG-UNIT TABS, Take 1 tablet by mouth daily., Disp: , Rfl:  .  DULoxetine (CYMBALTA) 60 MG capsule, Take 1 capsule (60 mg total) by mouth daily., Disp: 90 capsule, Rfl: 1 .  ezetimibe (ZETIA) 10 MG tablet, Take 1 tablet (10 mg total) by mouth daily., Disp: 90 tablet, Rfl: 3 .  glipiZIDE (GLUCOTROL XL) 5 MG 24 hr tablet, Take 1 tablet (5 mg total) by mouth daily with breakfast., Disp: 90 tablet, Rfl:  1 .  glucose blood (ONE TOUCH ULTRA TEST) test strip, BID, Disp: 100 each, Rfl: 12 .  Lancets (ONETOUCH ULTRASOFT) lancets, Use as instructed, Disp: 100 each, Rfl: 12 .  metFORMIN (GLUCOPHAGE XR) 500 MG 24 hr tablet, Take 4 tablets (2,000 mg total) by mouth daily with breakfast., Disp: 120 tablet, Rfl: 2 .  nortriptyline (PAMELOR) 25 MG capsule, Take 1 capsule (25 mg total) by mouth at bedtime., Disp: 90 capsule, Rfl: 0 .  olmesartan-hydrochlorothiazide (BENICAR HCT) 40-25 MG tablet, Take  0.5 tablets by mouth daily., Disp: 45 tablet, Rfl: 0 .  ondansetron (ZOFRAN-ODT) 4 MG disintegrating tablet, Take 1 tablet (4 mg total) by mouth every 8 (eight) hours as needed for nausea or vomiting., Disp: 20 tablet, Rfl: 0 .  potassium chloride SA (K-DUR,KLOR-CON) 20 MEQ tablet, Take 1 tablet (20 mEq total) by mouth 2 (two) times daily., Disp: 10 tablet, Rfl: 0 .  temazepam (RESTORIL) 15 MG capsule, Take 1 capsule (15 mg total) by mouth at bedtime as needed for sleep., Disp: 30 capsule, Rfl: 1 .  traMADol (ULTRAM) 50 MG tablet, Take 1 tablet (50 mg total) by mouth every 8 (eight) hours as needed., Disp: 30 tablet, Rfl: 0  Allergies  Allergen Reactions  . Sulfa Antibiotics   . Statins     Muscle aches    ROS   No other specific complaints in a complete review of systems (except as listed in HPI above).  Objective  Vitals:   04/12/18 1125  BP: 130/72  Pulse: 97  Resp: 14  Temp: 97.7 F (36.5 C)  TempSrc: Oral  SpO2: 98%  Weight: 134 lb 8 oz (61 kg)  Height: 5\' 3"  (1.6 m)    Body mass index is 23.83 kg/m.  Nursing Note and Vital Signs reviewed.  Physical Exam Vitals signs reviewed.  Constitutional:      General: She is not in acute distress.    Appearance: She is well-developed. She is ill-appearing. She is not toxic-appearing.  HENT:     Head: Normocephalic and atraumatic.  Neck:     Musculoskeletal: Normal range of motion and neck supple.     Vascular: No carotid bruit.  Cardiovascular:     Heart sounds: Normal heart sounds.  Pulmonary:     Effort: Pulmonary effort is normal.     Breath sounds: Normal breath sounds.  Abdominal:     General: Bowel sounds are decreased. There is no distension.     Palpations: Abdomen is soft.     Tenderness: There is abdominal tenderness in the left lower quadrant. There is no right CVA tenderness or left CVA tenderness. Negative signs include Murphy's sign and McBurney's sign.  Musculoskeletal: Normal range of motion.   Skin:    General: Skin is warm and dry.     Capillary Refill: Capillary refill takes less than 2 seconds.  Neurological:     Mental Status: She is alert and oriented to person, place, and time.     GCS: GCS eye subscore is 4. GCS verbal subscore is 5. GCS motor subscore is 6.     Sensory: No sensory deficit.  Psychiatric:        Speech: Speech normal.        Behavior: Behavior normal.        Thought Content: Thought content normal.        Judgment: Judgment normal.      No results found for this or any previous visit (from the past 48 hour(s)).  Assessment & Plan  1. RUQ pain Patient endorses RUQ pain and no improvement with fatigue, has no energy to get up or out of bed, almost constant nausea. No ketones in urine or anion gap in labs earlier. Seems to be worse in the morning and night but is taking Zofran and is relieved with this.  No association with food.  Has had elevations in bilirubin-consider gallbladder or liver pathology.  Fatigue may be caused by unimproving hypokalemia or hyponatremia as well- recheck these at the hospital.  Will discuss -Stop HCTZ- plain olmesartan 40mg  and 5mg  of amlodipine. Follow up on Monday- discussed ER precautions in detail. Plan to discuss cholesterol and diabetes at follow-up.  - US Abdomen Limited RUQ; Future - Bilirubin, fractionated (tot/dir/indir); Future - Comprehensive Metabolic Panel (CMET); Future  2. Acute cystitis with hematuria Symptomatically resolving, continue antibiotic   3. Hypokalemia - Comprehensive Metabolic Panel (CMET); Future  4. Hyponatremia - Comprehensive Metabolic Panel (CMET); Future  5. Intractable vomiting with nausea, unspecified vomiting type - US Abdomen Limited RUQ; Future  6. Elevated bilirubin - Bilirubin, fractionated (tot/dir/indir); Future  7. Essential hypertension - olmesartan (BENICAR) 40 MG tablet; Take 1 tablet (40 mg total) by mouth daily.  Dispense: 90 tablet; Refill: 0 - amLODipine  (NORVASC) 5 MG tablet; Take 1 tablet (5 mg total) by mouth daily.  Dispense: 90 tablet; Refill: 3    -Red flags and when to present for emergency care or RTC including palpitations, muscle cramping, increased fatigue, vomiting, chest pain, for blood sugars over 350 or new/worsening/un-resolving symptoms, reviewed with patient at time of visit. Follow up and care instructions discussed and provided in AVS.

## 2018-04-13 LAB — BASIC METABOLIC PANEL
BUN: 15 mg/dL (ref 7–25)
CO2: 23 mmol/L (ref 20–32)
Calcium: 9.6 mg/dL (ref 8.6–10.4)
Chloride: 100 mmol/L (ref 98–110)
Creat: 0.95 mg/dL (ref 0.50–0.99)
Glucose, Bld: 264 mg/dL — ABNORMAL HIGH (ref 65–99)
Potassium: 4.3 mmol/L (ref 3.5–5.3)
Sodium: 134 mmol/L — ABNORMAL LOW (ref 135–146)

## 2018-04-16 ENCOUNTER — Ambulatory Visit: Payer: Medicare HMO | Admitting: Nurse Practitioner

## 2018-04-16 ENCOUNTER — Ambulatory Visit (INDEPENDENT_AMBULATORY_CARE_PROVIDER_SITE_OTHER): Payer: Medicare HMO | Admitting: Nurse Practitioner

## 2018-04-16 ENCOUNTER — Encounter: Payer: Self-pay | Admitting: Nurse Practitioner

## 2018-04-16 VITALS — BP 116/62 | HR 82 | Temp 97.3°F | Resp 16 | Ht 63.0 in | Wt 133.8 lb

## 2018-04-16 DIAGNOSIS — E1169 Type 2 diabetes mellitus with other specified complication: Secondary | ICD-10-CM | POA: Diagnosis not present

## 2018-04-16 DIAGNOSIS — E785 Hyperlipidemia, unspecified: Secondary | ICD-10-CM

## 2018-04-16 DIAGNOSIS — N3000 Acute cystitis without hematuria: Secondary | ICD-10-CM | POA: Diagnosis not present

## 2018-04-16 DIAGNOSIS — L282 Other prurigo: Secondary | ICD-10-CM | POA: Diagnosis not present

## 2018-04-16 DIAGNOSIS — E782 Mixed hyperlipidemia: Secondary | ICD-10-CM

## 2018-04-16 DIAGNOSIS — K760 Fatty (change of) liver, not elsewhere classified: Secondary | ICD-10-CM | POA: Diagnosis not present

## 2018-04-16 DIAGNOSIS — D179 Benign lipomatous neoplasm, unspecified: Secondary | ICD-10-CM | POA: Diagnosis not present

## 2018-04-16 DIAGNOSIS — E876 Hypokalemia: Secondary | ICD-10-CM

## 2018-04-16 DIAGNOSIS — K8010 Calculus of gallbladder with chronic cholecystitis without obstruction: Secondary | ICD-10-CM | POA: Insufficient documentation

## 2018-04-16 DIAGNOSIS — E1142 Type 2 diabetes mellitus with diabetic polyneuropathy: Secondary | ICD-10-CM

## 2018-04-16 DIAGNOSIS — E871 Hypo-osmolality and hyponatremia: Secondary | ICD-10-CM | POA: Diagnosis not present

## 2018-04-16 MED ORDER — GLIPIZIDE ER 5 MG PO TB24: 10.0000 mg | ORAL_TABLET | Freq: Every day | ORAL | 1 refills | Status: DC

## 2018-04-16 MED ORDER — ICOSAPENT ETHYL 1 G PO CAPS: 2.0000 g | ORAL_CAPSULE | Freq: Two times a day (BID) | ORAL | 2 refills | Status: DC

## 2018-04-16 NOTE — Patient Instructions (Addendum)
- For the rash please take 25mg  of benadryl at night until it resolves, take allegra 1/2 tablet during the daytime. And famotidine 20 mg in the morning to help rash resolve.   - Please come on Thursday for labs only.   Some foods high in potassium are: avocados, bananas, coconut, sweet and regular potatoes, spinach, milk and lentils   Good cholesterol, also called high-density lipoprotein (HDL) removes extra cholesterol and plaque buildup in your arteries and then sends it to your liver to get rid of and helps reduce your risk of heart disease, heart attack, and stroke.Foods that increase HDL: beans and legumes, whole grains, high-fiber fruits:prunes, apples, and pears; fatty fish- salmon, tuna, sardines; nuts, olive oil    Gallbladder Eating Plan If you have a gallbladder condition, you may have trouble digesting fats. Eating a low-fat diet can help reduce your symptoms, and may be helpful before and after having surgery to remove your gallbladder (cholecystectomy). Your health care provider may recommend that you work with a diet and nutrition specialist (dietitian) to help you reduce the amount of fat in your diet. What are tips for following this plan? General guidelines  Limit your fat intake to less than 30% of your total daily calories. If you eat around 1,800 calories each day, this is less than 60 grams (g) of fat per day.  Fat is an important part of a healthy diet. Eating a low-fat diet can make it hard to maintain a healthy body weight. Ask your dietitian how much fat, calories, and other nutrients you need each day.  Eat small, frequent meals throughout the day instead of three large meals.  Drink at least 8-10 cups of fluid a day. Drink enough fluid to keep your urine clear or pale yellow.  Limit alcohol intake to no more than 1 drink a day for nonpregnant women and 2 drinks a day for men. One drink equals 12 oz of beer, 5 oz of wine, or 1 oz of hard liquor. Reading food  labels  Check Nutrition Facts on food labels for the amount of fat per serving. Choose foods with less than 3 grams of fat per serving. Shopping  Choose nonfat and low-fat healthy foods. Look for the words "nonfat," "low fat," or "fat free."  Avoid buying processed or prepackaged foods. Cooking  Cook using low-fat methods, such as baking, broiling, grilling, or boiling.  Cook with small amounts of healthy fats, such as olive oil, grapeseed oil, canola oil, or sunflower oil. What foods are recommended?   All fresh, frozen, or canned fruits and vegetables.  Whole grains.  Low-fat or non-fat (skim) milk and yogurt.  Lean meat, skinless poultry, fish, eggs, and beans.  Low-fat protein supplement powders or drinks.  Spices and herbs. What foods are not recommended?  High-fat foods. These include baked goods, fast food, fatty cuts of meat, ice cream, french toast, sweet rolls, pizza, cheese bread, foods covered with butter, creamy sauces, or cheese.  Fried foods. These include french fries, tempura, battered fish, breaded chicken, fried breads, and sweets.  Foods with strong odors.  Foods that cause bloating and gas. Summary  A low-fat diet can be helpful if you have a gallbladder condition, or before and after gallbladder surgery.  Limit your fat intake to less than 30% of your total daily calories. This is about 60 g of fat if you eat 1,800 calories each day.  Eat small, frequent meals throughout the day instead of three large meals. This  information is not intended to replace advice given to you by your health care provider. Make sure you discuss any questions you have with your health care provider. Document Released: 02/04/2013 Document Revised: 03/09/2016 Document Reviewed: 03/09/2016 Elsevier Interactive Patient Education  2019 [employer].

## 2018-04-16 NOTE — Progress Notes (Signed)
Name: Brooke Randall   MRN: [id number]    DOB: Sep 13, 1950   Date:04/16/2018       Progress Note  Subjective  Chief Complaint  Chief Complaint  Patient presents with  . Follow-up  . Abdominal Pain    Still having lower middle cramping pain that is constant and bothering her  . Rash    Yesterday starts in her scalp and has radiated to her chest, neck and back. Itchy, red and took allergy medication which helped relieve her symptoms but they are back.  . Urinary Tract Infection    Symptoms resolved with antibiotic    HPI  Patient presents for rash that started yesterday on her scalp and has spread down to her neck, chest  and back. It is very itchy. She took an benadryl last night to help relief symptoms and provided temporary relief of symptoms but it returned this morning. No new medicines, shampoos, lotions, detergents, soaps, hair dyes. States she opened a new bottle of shampoo but she thinks its the same brand.   Patient states UTI symptoms resolved and has completed antibiotic course.   Patient endorses continued abdominal pain and cramping- referred to general surgery for cholelithiasis.   Fatty liver disease: taking zetia, had intolerance to pravastatin in the past  Diabetes: is taking metformin 2 tablets daily and glipizide 10mg  states has decreased from upper 200-300's to now in the 190-225 range. Feels it is improving and would like to see if it continues to decrease. Would like to see how she does on this dose.   Patient Active Problem List   Diagnosis Date Noted  . Injury of tendon of long head of left biceps 10/05/2017  . Nontraumatic incomplete tear of left rotator cuff 10/05/2017  . Rotator cuff tendinitis, left 10/05/2017  . Degenerative tear of glenoid labrum of left shoulder 10/05/2017  . Arthropathy of spinal facet joint 06/05/2017  . Osteoarthritis of knee 06/05/2017  . Diabetic retinopathy associated with controlled type 2 diabetes mellitus ([place]) 06/05/2017  .  Current moderate episode of major depressive disorder without prior episode ([place]) 05/14/2017  . Impingement syndrome of left shoulder region 03/22/2017  . Mass of right hand 04/21/2016  . Post-menopausal 03/31/2016  . Hyperlipidemia 12/17/2014  . Well controlled type 2 diabetes mellitus with peripheral neuropathy ([place]) 12/17/2014  . Hypertension 08/20/2014  . Vitamin D deficiency 08/20/2014  . Lumbar disc herniation with radiculopathy 09/12/2012    Past Medical History:  Diagnosis Date  . Arthritis   . Diabetes mellitus without complication ([place])   . Hypertension   . Osteoporosis     Past Surgical History:  Procedure Laterality Date  . FOOT SURGERY Right 1999    Social History   Tobacco Use  . Smoking status: Never Smoker  . Smokeless tobacco: Never Used  Substance Use Topics  . Alcohol use: No    Alcohol/week: 0.0 standard drinks     Current Outpatient Medications:  .  amLODipine (NORVASC) 5 MG tablet, Take 1 tablet (5 mg total) by mouth daily., Disp: 90 tablet, Rfl: 3 .  Calcium Carbonate-Vit D-Min (CALCIUM/VITAMIN D/MINERALS) 600-200 MG-UNIT TABS, Take 1 tablet by mouth daily., Disp: , Rfl:  .  DULoxetine (CYMBALTA) 60 MG capsule, Take 1 capsule (60 mg total) by mouth daily., Disp: 90 capsule, Rfl: 1 .  ezetimibe (ZETIA) 10 MG tablet, Take 1 tablet (10 mg total) by mouth daily., Disp: 90 tablet, Rfl: 3 .  glipiZIDE (GLUCOTROL XL) 5 MG 24 hr tablet, Take 2  tablets (10 mg total) by mouth daily with breakfast., Disp: 90 tablet, Rfl: 1 .  glucose blood (ONE TOUCH ULTRA TEST) test strip, BID, Disp: 100 each, Rfl: 12 .  Lancets (ONETOUCH ULTRASOFT) lancets, Use as instructed, Disp: 100 each, Rfl: 12 .  metFORMIN (GLUCOPHAGE XR) 500 MG 24 hr tablet, Take 4 tablets (2,000 mg total) by mouth daily with breakfast., Disp: 120 tablet, Rfl: 2 .  nortriptyline (PAMELOR) 25 MG capsule, Take 1 capsule (25 mg total) by mouth at bedtime., Disp: 90 capsule, Rfl: 0 .  olmesartan (BENICAR)  40 MG tablet, Take 1 tablet (40 mg total) by mouth daily., Disp: 90 tablet, Rfl: 0 .  olmesartan-hydrochlorothiazide (BENICAR HCT) 40-25 MG tablet, Take 0.5 tablets by mouth daily., Disp: 45 tablet, Rfl: 0 .  ondansetron (ZOFRAN-ODT) 4 MG disintegrating tablet, Take 1 tablet (4 mg total) by mouth every 8 (eight) hours as needed for nausea or vomiting., Disp: 20 tablet, Rfl: 0 .  potassium chloride SA (K-DUR,KLOR-CON) 20 MEQ tablet, Take 1 tablet (20 mEq total) by mouth 2 (two) times daily., Disp: 10 tablet, Rfl: 0 .  temazepam (RESTORIL) 15 MG capsule, Take 1 capsule (15 mg total) by mouth at bedtime as needed for sleep., Disp: 30 capsule, Rfl: 1 .  traMADol (ULTRAM) 50 MG tablet, Take 1 tablet (50 mg total) by mouth every 8 (eight) hours as needed., Disp: 30 tablet, Rfl: 0 .  Icosapent Ethyl 1 g CAPS, Take 2 capsules (2 g total) by mouth 2 (two) times daily., Disp: 120 capsule, Rfl: 2  Allergies  Allergen Reactions  . Sulfa Antibiotics   . Statins     Muscle aches    ROS   No other specific complaints in a complete review of systems (except as listed in HPI above).  Objective  Vitals:   04/16/18 1123 04/16/18 1126  BP: 116/62   Pulse: (!) 102 82  Resp: 18 16  Temp: (!) 97.3 F (36.3 C)   TempSrc: Oral   SpO2: 96% 99%  Weight: 133 lb 12.8 oz (60.7 kg)   Height: 5\' 3"  (1.6 m)     Body mass index is 23.7 kg/m.  Nursing Note and Vital Signs reviewed.  Physical Exam Constitutional:      Appearance: Normal appearance. She is well-developed.  HENT:     Head: Normocephalic and atraumatic.     Right Ear: Hearing normal.     Left Ear: Hearing normal.  Eyes:     Conjunctiva/sclera: Conjunctivae normal.  Cardiovascular:     Rate and Rhythm: Normal rate and regular rhythm.     Heart sounds: Normal heart sounds.  Pulmonary:     Effort: Pulmonary effort is normal.     Breath sounds: Normal breath sounds.  Musculoskeletal: Normal range of motion.  Skin:      Neurological:       Mental Status: She is alert and oriented to person, place, and time.  Psychiatric:        Speech: Speech normal.        Behavior: Behavior normal. Behavior is cooperative.        Thought Content: Thought content normal.        Judgment: Judgment normal.        No results found for this or any previous visit (from the past 48 hour(s)).  Assessment & Plan  1. Pruritic rash Benadryl and famotidine and half dose of allegra for the next 3 days; only increasing anti-histamine dosage temporarily. Check shampoo to  see if it is a different kind, use plain, unscented soaps. Follow up if not improving or not resolved in a week or if worsening at any time.   2. Fatty liver Discussed diet and GI referral, will hold off now- LFTs WNL, start on vascepa, recommend trialing atorvastatin 3 days a week- would like to hold for now due feeling ill.   3. Calculus of gallbladder with chronic cholecystitis without obstruction Follow low fat diet, follow-up with general surgeon, discussed er precautios  4. Mixed hyperlipidemia - Icosapent Ethyl 1 g CAPS; Take 2 capsules (2 g total) by mouth 2 (two) times daily.  Dispense: 120 capsule; Refill: 2  5. Well controlled type 2 diabetes mellitus with peripheral neuropathy ([place]) Discussed risks of glipizide in the elderly; patient would like to stay on this for now as she feels like there is too much change right now. Follow up in one month or sooner if needed. Discussed hypoglycemia risk- will monitor.  - glipiZIDE (GLUCOTROL XL) 5 MG 24 hr tablet; Take 2 tablets (10 mg total) by mouth daily with breakfast.  Dispense: 90 tablet; Refill: 1  6. Dyslipidemia associated with type 2 diabetes mellitus ([facility]) Add vasecpa, discussed diet.  - glipiZIDE (GLUCOTROL XL) 5 MG 24 hr tablet; Take 2 tablets (10 mg total) by mouth daily with breakfast.  Dispense: 90 tablet; Refill: 1  7. Hypokalemia Increase in diet, will recheck on Thursday  - BASIC METABOLIC PANEL WITH  GFR  8. Hyponatremia Increase in diet, will recheck on Thursday  - BASIC METABOLIC PANEL WITH GFR  9. Angiomyolipoma Will monitor, normal GFR  10. Acute cystitis without hematuria resolved   -Red flags and when to present for emergency care or RTC including fever >101.5F, chest pain, shortness of breath, new/worsening/un-resolving symptoms, reviewed with patient at time of visit. Follow up and care instructions discussed and provided in AVS.

## 2018-04-17 ENCOUNTER — Ambulatory Visit: Payer: Medicare HMO | Admitting: Family Medicine

## 2018-04-25 ENCOUNTER — Ambulatory Visit: Payer: Medicare HMO | Admitting: General Surgery

## 2018-04-25 DIAGNOSIS — R69 Illness, unspecified: Secondary | ICD-10-CM | POA: Diagnosis not present

## 2018-04-25 HISTORY — PX: DENTAL SURGERY: SHX609

## 2018-04-28 ENCOUNTER — Other Ambulatory Visit: Payer: Self-pay | Admitting: Family Medicine

## 2018-04-28 DIAGNOSIS — F320 Major depressive disorder, single episode, mild: Secondary | ICD-10-CM

## 2018-04-29 ENCOUNTER — Ambulatory Visit: Payer: Medicare HMO | Admitting: General Surgery

## 2018-04-30 ENCOUNTER — Ambulatory Visit (INDEPENDENT_AMBULATORY_CARE_PROVIDER_SITE_OTHER): Payer: Medicare HMO | Admitting: General Surgery

## 2018-04-30 ENCOUNTER — Telehealth: Payer: Self-pay | Admitting: Family Medicine

## 2018-04-30 ENCOUNTER — Other Ambulatory Visit: Payer: Self-pay

## 2018-04-30 ENCOUNTER — Encounter: Payer: Self-pay | Admitting: General Surgery

## 2018-04-30 VITALS — BP 170/90 | HR 76 | Resp 14 | Ht 63.0 in | Wt 132.0 lb

## 2018-04-30 DIAGNOSIS — R112 Nausea with vomiting, unspecified: Secondary | ICD-10-CM | POA: Diagnosis not present

## 2018-04-30 NOTE — Progress Notes (Signed)
Patient ID: Brooke Randall, female   DOB: 03/16/1950, 68 y.o.   MRN: [id number]  Chief Complaint  Patient presents with  . Other    gallbladder    HPI Brooke Randall is a 68 y.o. female here for gallbladder problems. She has had some nausea and vomiting for about 3 months.  Nausea and vomiting has not been associated with abdominal pain.  There does not appear to be a clear dietary pattern, but the patient reports over the last year she finds that she is satisfied with about half of the normal volume of food than she was in the past.  She thought that she may have lost up to 9 pounds over the last year.  The patient has not had any fever or chills, nor episodes of diarrhea.    Marland Kitchen  HPI  Past Medical History:  Diagnosis Date  . Arthritis   . Diabetes mellitus without complication ([place])   . Hyperlipidemia   . Hypertension   . Nausea & vomiting   . Osteoporosis     Past Surgical History:  Procedure Laterality Date  . DENTAL SURGERY  04/25/2018  . FOOT SURGERY Right 1999    Family History  Problem Relation Age of Onset  . Hypertension Mother   . Hypertension Father   . Diabetes Father   . Hypertension Sister   . Diabetes Sister   . Hypertension Brother     Social History Social History   Tobacco Use  . Smoking status: Never Smoker  . Smokeless tobacco: Never Used  Substance Use Topics  . Alcohol use: No    Alcohol/week: 0.0 standard drinks  . Drug use: No    Allergies  Allergen Reactions  . Sulfa Antibiotics   . Statins     Muscle aches    Current Outpatient Medications  Medication Sig Dispense Refill  . Calcium Carbonate-Vit D-Min (CALCIUM/VITAMIN D/MINERALS) 600-200 MG-UNIT TABS Take 1 tablet by mouth daily.    . DULoxetine (CYMBALTA) 60 MG capsule Take 1 capsule by mouth once daily 90 capsule 0  . glipiZIDE (GLUCOTROL XL) 5 MG 24 hr tablet Take 2 tablets (10 mg total) by mouth daily with breakfast. 90 tablet 1  . glucose blood (ONE TOUCH ULTRA TEST) test strip  BID 100 each 12  . Icosapent Ethyl 1 g CAPS Take 2 capsules (2 g total) by mouth 2 (two) times daily. 120 capsule 2  . Lancets (ONETOUCH ULTRASOFT) lancets Use as instructed 100 each 12  . metFORMIN (GLUCOPHAGE XR) 500 MG 24 hr tablet Take 4 tablets (2,000 mg total) by mouth daily with breakfast. (Patient taking differently: Take 1,000 mg by mouth 2 (two) times daily. ) 120 tablet 2  . ondansetron (ZOFRAN-ODT) 4 MG disintegrating tablet Take 1 tablet (4 mg total) by mouth every 8 (eight) hours as needed for nausea or vomiting. 20 tablet 0   No current facility-administered medications for this visit.     Review of Systems Review of Systems  Constitutional: Negative.   Respiratory: Negative.   Cardiovascular: Negative.     Blood pressure (!) 170/90, pulse 76, resp. rate 14, height 5\' 3"  (1.6 m), weight 132 lb (59.9 kg), SpO2 97 %.  Physical Exam Physical Exam Constitutional:      Appearance: She is well-developed.  Eyes:     General: No scleral icterus.    Conjunctiva/sclera: Conjunctivae normal.  Neck:     Musculoskeletal: Neck supple.  Cardiovascular:     Rate and Rhythm: Normal rate and  regular rhythm.     Heart sounds: Normal heart sounds.  Pulmonary:     Effort: Pulmonary effort is normal.     Breath sounds: Normal breath sounds.  Abdominal:     General: Abdomen is flat. Bowel sounds are normal.     Palpations: Abdomen is soft.     Tenderness: There is no abdominal tenderness.  Lymphadenopathy:     Cervical: No cervical adenopathy.  Skin:    General: Skin is warm and dry.  Neurological:     Mental Status: She is alert and oriented to person, place, and time.     Data Reviewed April 12, 2018 abdominal ultrasound reviewed: Cholelithiasis, posterior wall gallbladder polyp.  Hepatic steatosis.  Angiomyolipoma of the right kidney. Laboratory studies of the same date showed an elevated blood sugar of 243 (had been on oral prednisone for back pain) normal  electrolytes, normal renal function with an estimated GFR of greater than 60.  Normal liver function studies.      Assessment Somewhat atypical presentation for straightforward biliary colic, but with identification of gallstones certainly a possibility.  Review of the chart shows that the patient has lost 4 pounds over the past year.  Report of early satiety is of some concern, and an upper GI series should be obtained prior to consideration of elective cholecystectomy.  Plan  Patient to call back when ready to scheduled.   Laparoscopic Cholecystectomy with Intraoperative Cholangiogram. The procedure, including it's potential risks and complications (including but not limited to infection, bleeding, injury to intra-abdominal organs or bile ducts, bile leak, poor cosmetic result, sepsis and death) were discussed with the patient in detail. Non-operative options, including their inherent risks (acute calculous cholecystitis with possible choledocholithiasis or gallstone pancreatitis, with the risk of ascending cholangitis, sepsis, and death) were discussed as well. The patient expressed and understanding of what we discussed and wishes to proceed with laparoscopic cholecystectomy. The patient further understands that if it is technically not possible, or it is unsafe to proceed laparoscopically, that I will convert to an open cholecystectomy.  HPI, Physical Exam, Assessment and Plan have been scribed under the direction and in the presence of Hervey Ard, MD.  Gaspar Cola, CMA  I have completed the exam and reviewed the above documentation for accuracy and completeness.  I agree with the above.  Haematologist has been used and any errors in dictation or transcription are unintentional.  Hervey Ard, M.D., F.A.C.S.  Forest Gleason Filimon Miranda 05/01/2018, 2:46 PM  Patient has been scheduled for an upper GI at [facility] for 05-03-18 at 9 am (arrive 10 minutes prior). Prep: NPO after  midnight.  The patient was provided with phone interview instructions to hang on to in case she decides to have gallbladder surgery in the near future. Patient to call to arrange.  Dominga Ferry, CMA

## 2018-04-30 NOTE — Patient Instructions (Addendum)
Cholelithiasis  Cholelithiasis is also called "gallstones." It is a kind of gallbladder disease. The gallbladder is an organ that stores a liquid (bile) that helps you digest fat. Gallstones may not cause symptoms (may be silent gallstones) until they cause a blockage, and then they can cause pain (gallbladder attack). Follow these instructions at home:  Take over-the-counter and prescription medicines only as told by your doctor.  Stay at a healthy weight.  Eat healthy foods. This includes: ? Eating fewer fatty foods, like fried foods. ? Eating fewer refined carbs (refined carbohydrates). Refined carbs are breads and grains that are highly processed, like white bread and white rice. Instead, choose whole grains like whole-wheat bread and brown rice. ? Eating more fiber. Almonds, fresh fruit, and beans are healthy sources of fiber.  Keep all follow-up visits as told by your doctor. This is important. Contact a doctor if:  You have sudden pain in the upper right side of your belly (abdomen). Pain might spread to your right shoulder or your chest. This may be a sign of a gallbladder attack.  You feel sick to your stomach (are nauseous).  You throw up (vomit).  You have been diagnosed with gallstones that have no symptoms and you get: ? Belly pain. ? Discomfort, burning, or fullness in the upper part of your belly (indigestion). Get help right away if:  You have sudden pain in the upper right side of your belly, and it lasts for more than 2 hours.  You have belly pain that lasts for more than 5 hours.  You have a fever or chills.  You keep feeling sick to your stomach or you keep throwing up.  Your skin or the whites of your eyes turn yellow (jaundice).  You have dark-colored pee (urine).  You have light-colored poop (stool). Summary  Cholelithiasis is also called "gallstones."  The gallbladder is an organ that stores a liquid (bile) that helps you digest fat.  Silent  gallstones are gallstones that do not cause symptoms.  A gallbladder attack may cause sudden pain in the upper right side of your belly. Pain might spread to your right shoulder or your chest. If this happens, contact your doctor.  If you have sudden pain in the upper right side of your belly that lasts for more than 2 hours, get help right away. This information is not intended to replace advice given to you by your health care provider. Make sure you discuss any questions you have with your health care provider. Document Released: 07/19/2007 Document Revised: 10/17/2015 Document Reviewed: 10/17/2015 Elsevier Interactive Patient Education  2019 [employer].    Call when ready to schedule.

## 2018-04-30 NOTE — Telephone Encounter (Signed)
Pt said that the fish oil that [facility] prescribed for her is 400.00 and she can not afford it and needs something else to take. Also she is needing refill on her anxiety that [facility] placed her on about 6 months ago.Pharm is [facility] on [address]

## 2018-04-30 NOTE — Telephone Encounter (Signed)
I just sent Duloxetine to her pharmacy.  She needs to ask insurance what fish oil rx to they pay for. Brooke Randall sent a generic rx

## 2018-05-01 NOTE — Telephone Encounter (Signed)
Patient called.  Patient aware.  

## 2018-05-03 ENCOUNTER — Ambulatory Visit: Payer: Medicare HMO

## 2018-05-03 ENCOUNTER — Encounter: Payer: Self-pay | Admitting: Family Medicine

## 2018-05-06 ENCOUNTER — Telehealth: Payer: Self-pay | Admitting: Family Medicine

## 2018-05-06 ENCOUNTER — Other Ambulatory Visit: Payer: Self-pay | Admitting: Nurse Practitioner

## 2018-05-06 DIAGNOSIS — E1169 Type 2 diabetes mellitus with other specified complication: Secondary | ICD-10-CM

## 2018-05-06 DIAGNOSIS — E1142 Type 2 diabetes mellitus with diabetic polyneuropathy: Secondary | ICD-10-CM

## 2018-05-06 DIAGNOSIS — E785 Hyperlipidemia, unspecified: Secondary | ICD-10-CM

## 2018-05-06 MED ORDER — GLIPIZIDE ER 5 MG PO TB24: 10.0000 mg | ORAL_TABLET | Freq: Every day | ORAL | 2 refills | Status: DC

## 2018-05-06 MED ORDER — METFORMIN HCL ER 500 MG PO TB24: 1000.0000 mg | ORAL_TABLET | Freq: Two times a day (BID) | ORAL | 2 refills | Status: DC

## 2018-05-06 NOTE — Telephone Encounter (Signed)
Discussed Covid 19 concerns with patient and gave appropriate guidelines and information.

## 2018-05-14 DIAGNOSIS — H43812 Vitreous degeneration, left eye: Secondary | ICD-10-CM | POA: Diagnosis not present

## 2018-05-17 ENCOUNTER — Encounter: Payer: Self-pay | Admitting: Nurse Practitioner

## 2018-05-17 ENCOUNTER — Ambulatory Visit (INDEPENDENT_AMBULATORY_CARE_PROVIDER_SITE_OTHER): Payer: Medicare HMO | Admitting: Nurse Practitioner

## 2018-05-17 VITALS — BP 163/84 | HR 74 | Ht 63.0 in | Wt 132.0 lb

## 2018-05-17 DIAGNOSIS — Z719 Counseling, unspecified: Secondary | ICD-10-CM

## 2018-05-17 DIAGNOSIS — R69 Illness, unspecified: Secondary | ICD-10-CM | POA: Diagnosis not present

## 2018-05-17 DIAGNOSIS — F320 Major depressive disorder, single episode, mild: Secondary | ICD-10-CM | POA: Diagnosis not present

## 2018-05-17 DIAGNOSIS — E782 Mixed hyperlipidemia: Secondary | ICD-10-CM

## 2018-05-17 DIAGNOSIS — E1169 Type 2 diabetes mellitus with other specified complication: Secondary | ICD-10-CM | POA: Diagnosis not present

## 2018-05-17 DIAGNOSIS — R5383 Other fatigue: Secondary | ICD-10-CM

## 2018-05-17 DIAGNOSIS — E785 Hyperlipidemia, unspecified: Secondary | ICD-10-CM

## 2018-05-17 DIAGNOSIS — I10 Essential (primary) hypertension: Secondary | ICD-10-CM

## 2018-05-17 MED ORDER — ICOSAPENT ETHYL 1 G PO CAPS: 2.0000 g | ORAL_CAPSULE | Freq: Two times a day (BID) | ORAL | 2 refills | Status: DC

## 2018-05-17 MED ORDER — AMLODIPINE BESYLATE 5 MG PO TABS: 5.0000 mg | ORAL_TABLET | Freq: Every day | ORAL | 0 refills | Status: DC

## 2018-05-17 NOTE — Progress Notes (Signed)
Name: Brooke Randall   MRN: [id number]    DOB: 10-28-1950   Date:05/17/2018  Virtual Visit via Telephone Note  I connected with [place] on 05/17/18 at  1:20 PM EDT by telephone and verified that I am speaking with the correct person using two identifiers.   Staff discussed the limitations, risks, security and privacy concerns of performing an evaluation and management service by telephone and the availability of in person appointmentshe patient expressed understanding and agreed to proceed.  HPI  Feels overall she is doing much better. Her energy is back, she is not feeling depressed or fatigued.   Diabetes Taking metformin 2000mg  daily and glipizide 10mg  daily for one month then go back down to glipizide 5mg .  Fasting blood sugars 159-205; previously was in the 300's Lab Results  Component Value Date   HGBA1C 7.9 (A) 04/08/2018   Hyperlipidemia Patient states could not afford lovaza.  Lab Results  Component Value Date   CHOL 189 04/09/2018   HDL 34 (L) 04/09/2018   LDLCALC 108 (H) 04/09/2018   TRIG 233 (H) 04/09/2018   CHOLHDL 5.6 04/09/2018    Hypertension  Takes telmisartan 80mg  and HCTZ 12.5mg  daily. States her BP has been elevated recently, despite taking her medications as prescribed without missed doses.   Denies increasing salt in diet, NSAID or steroid use.  Denies chest pain, headache, blurry vision, dizziness.   BP Readings from Last 3 Encounters:  05/17/18 (!) 163/84  04/30/18 (!) 170/90  04/16/18 116/62       PHQ2/9: Depression screen PHQ 2/9 05/17/2018 04/12/2018 04/08/2018 10/26/2017 07/26/2017  Decreased Interest 0 0 0 0 1  Down, Depressed, Hopeless 0 1 1 0 1  PHQ - 2 Score 0 1 1 0 2  Altered sleeping 0 1 - 0 1  Tired, decreased energy 0 1 - 1 2  Change in appetite 0 0 - 0 1  Feeling bad or failure about yourself  0 0 - 0 0  Trouble concentrating 0 0 - 0 0  Moving slowly or fidgety/restless 0 0 - 0 0  Suicidal thoughts 0 0 - 0 0  PHQ-9 Score 0 3 - 1 6   Difficult doing work/chores Not difficult at all Somewhat difficult - Somewhat difficult -     PHQ reviewed. Negative  Patient Active Problem List   Diagnosis Date Noted  . Angiomyolipoma 04/16/2018  . Fatty liver 04/16/2018  . Calculus of gallbladder with chronic cholecystitis without obstruction 04/16/2018  . Injury of tendon of long head of left biceps 10/05/2017  . Nontraumatic incomplete tear of left rotator cuff 10/05/2017  . Rotator cuff tendinitis, left 10/05/2017  . Degenerative tear of glenoid labrum of left shoulder 10/05/2017  . Arthropathy of spinal facet joint 06/05/2017  . Osteoarthritis of knee 06/05/2017  . Diabetic retinopathy associated with controlled type 2 diabetes mellitus (Brooke Randall) 06/05/2017  . Current moderate episode of major depressive disorder without prior episode (Brooke Randall) 05/14/2017  . Impingement syndrome of left shoulder region 03/22/2017  . Mass of right hand 04/21/2016  . Post-menopausal 03/31/2016  . Hyperlipidemia 12/17/2014  . Well controlled type 2 diabetes mellitus with peripheral neuropathy (Brooke Randall) 12/17/2014  . Hypertension 08/20/2014  . Vitamin D deficiency 08/20/2014  . Lumbar disc herniation with radiculopathy 09/12/2012    Past Medical History:  Diagnosis Date  . Arthritis   . Diabetes mellitus without complication (Brooke Randall)   . Hyperlipidemia   . Hypertension   . Nausea & vomiting   . Osteoporosis  Past Surgical History:  Procedure Laterality Date  . DENTAL SURGERY  04/25/2018  . FOOT SURGERY Right 1999    Social History   Tobacco Use  . Smoking status: Never Smoker  . Smokeless tobacco: Never Used  Substance Use Topics  . Alcohol use: No    Alcohol/week: 0.0 standard drinks     Current Outpatient Medications:  .  Calcium Carbonate-Vit D-Min (CALCIUM/VITAMIN D/MINERALS) 600-200 MG-UNIT TABS, Take 1 tablet by mouth daily., Disp: , Rfl:  .  DULoxetine (CYMBALTA) 60 MG capsule, Take 1 capsule by mouth once daily, Disp: 90  capsule, Rfl: 0 .  glipiZIDE (GLUCOTROL XL) 5 MG 24 hr tablet, Take 2 tablets (10 mg total) by mouth daily with breakfast., Disp: 60 tablet, Rfl: 2 .  glucose blood (ONE TOUCH ULTRA TEST) test strip, BID, Disp: 100 each, Rfl: 12 .  Lancets (ONETOUCH ULTRASOFT) lancets, Use as instructed, Disp: 100 each, Rfl: 12 .  metFORMIN (GLUCOPHAGE XR) 500 MG 24 hr tablet, Take 2 tablets (1,000 mg total) by mouth 2 (two) times daily., Disp: 120 tablet, Rfl: 2 .  telmisartan-hydrochlorothiazide (MICARDIS HCT) 80-12.5 MG tablet, Take 1 tablet by mouth daily., Disp: , Rfl:  .  Icosapent Ethyl 1 g CAPS, Take 2 capsules (2 g total) by mouth 2 (two) times daily. (Patient not taking: Reported on 05/17/2018), Disp: 120 capsule, Rfl: 2 .  ondansetron (ZOFRAN-ODT) 4 MG disintegrating tablet, Take 1 tablet (4 mg total) by mouth every 8 (eight) hours as needed for nausea or vomiting. (Patient not taking: Reported on 05/17/2018), Disp: 20 tablet, Rfl: 0  Allergies  Allergen Reactions  . Sulfa Antibiotics   . Statins     Muscle aches    ROS    No other specific complaints in a complete review of systems (except as listed in HPI above).  Objective  Vitals:   05/17/18 1120  BP: (!) 163/84  Pulse: 74  Weight: 132 lb (59.9 kg)  Height: 5\' 3"  (1.6 m)     Body mass index is 23.38 kg/m.  Nursing Note and Vital Signs reviewed.  Objective Alert and oriented Able to speak in full sentences  Assessment and Plan: 1. Dyslipidemia associated with type 2 diabetes mellitus (Brooke Randall) Blood sugars have improved she is currently taking 2000mg  of metformin daily and 5mg  of glipizide, no hypoglycemic events. Decreased from 10mg  of glipizide 2 days ago. Will continue to monitor.  2. Uncontrolled hypertension Will add on to current regimen, patient will keep blood pressure log and follow-up in 2 weeks.  - amLODipine (NORVASC) 5 MG tablet; Take 1 tablet (5 mg total) by mouth daily.  Dispense: 90 tablet; Refill: 0  3.  Fatigue, unspecified type Resolved  4. Depression, major, single episode, mild (Brooke Randall) Stables, continue medication   5. Mixed hyperlipidemia Will re-order and work on prior auth - Icosapent Ethyl 1 g CAPS; Take 2 capsules (2 g total) by mouth 2 (two) times daily.  Dispense: 120 capsule; Refill: 2  6. Counseling, unspecified COVID19 education provided   Follow Up Instructions:   3 week virtual visit on BP  I discussed the assessment and treatment plan with the patient. The patient was provided an opportunity to ask questions and all were answered. The patient agreed with the plan and demonstrated an understanding of the instructions.   The patient was advised to call back or seek an in-person evaluation if the symptoms worsen or if the condition fails to improve as anticipated.  I provided 28 minutes of  non-face-to-face time during this encounter.   Fredderick Severance, NP

## 2018-06-03 ENCOUNTER — Other Ambulatory Visit: Payer: Self-pay

## 2018-06-03 ENCOUNTER — Ambulatory Visit
Admission: RE | Admit: 2018-06-03 | Discharge: 2018-06-03 | Disposition: A | Discharge status: Home / Self Care | Payer: Medicare HMO | Source: Ambulatory Visit | Attending: General Surgery | Admitting: General Surgery

## 2018-06-03 ENCOUNTER — Telehealth: Payer: Self-pay

## 2018-06-03 DIAGNOSIS — R112 Nausea with vomiting, unspecified: Secondary | ICD-10-CM | POA: Insufficient documentation

## 2018-06-03 DIAGNOSIS — K5700 Diverticulitis of small intestine with perforation and abscess without bleeding: Secondary | ICD-10-CM | POA: Diagnosis not present

## 2018-06-03 NOTE — Telephone Encounter (Signed)
-----   Message from Robert Bellow, MD sent at 06/03/2018  1:44 PM EDT ----- Please notify the patient that her UGI was OK. No blockage or ulcers.  If her nausea and vomiting has resolved, encourage her to call if it returns, or if she develops any abdominal pain.  If still present, arrange for a phone f/u to discuss elective gallbladder removal. Thanks.  ----- Message ----- From: Interface, Rad Results In Sent: 06/03/2018  12:01 PM EDT To: Robert Bellow, MD

## 2018-06-03 NOTE — Telephone Encounter (Signed)
Notified patient as instructed, patient pleased. Discussed follow-up appointments, patient states that the nausea and vomiting have resolved and she has no abdominal pain. She will call if this returns.

## 2018-06-07 ENCOUNTER — Other Ambulatory Visit: Payer: Self-pay

## 2018-06-07 ENCOUNTER — Ambulatory Visit (INDEPENDENT_AMBULATORY_CARE_PROVIDER_SITE_OTHER): Payer: Medicare HMO | Admitting: Nurse Practitioner

## 2018-06-07 ENCOUNTER — Encounter: Payer: Self-pay | Admitting: Nurse Practitioner

## 2018-06-07 VITALS — BP 142/84

## 2018-06-07 DIAGNOSIS — I10 Essential (primary) hypertension: Secondary | ICD-10-CM

## 2018-06-07 DIAGNOSIS — E1142 Type 2 diabetes mellitus with diabetic polyneuropathy: Secondary | ICD-10-CM | POA: Diagnosis not present

## 2018-06-07 DIAGNOSIS — E782 Mixed hyperlipidemia: Secondary | ICD-10-CM | POA: Diagnosis not present

## 2018-06-07 DIAGNOSIS — R69 Illness, unspecified: Secondary | ICD-10-CM | POA: Diagnosis not present

## 2018-06-07 DIAGNOSIS — E785 Hyperlipidemia, unspecified: Secondary | ICD-10-CM

## 2018-06-07 DIAGNOSIS — E1169 Type 2 diabetes mellitus with other specified complication: Secondary | ICD-10-CM | POA: Diagnosis not present

## 2018-06-07 DIAGNOSIS — F320 Major depressive disorder, single episode, mild: Secondary | ICD-10-CM

## 2018-06-07 MED ORDER — OMEGA-3-ACID ETHYL ESTERS 1 G PO CAPS: 1.0000 g | ORAL_CAPSULE | Freq: Two times a day (BID) | ORAL | 0 refills | Status: DC

## 2018-06-07 MED ORDER — TELMISARTAN-HCTZ 80-12.5 MG PO TABS: 1.0000 | ORAL_TABLET | Freq: Every day | ORAL | 0 refills | Status: DC

## 2018-06-07 MED ORDER — AMLODIPINE BESYLATE 10 MG PO TABS: 5.0000 mg | ORAL_TABLET | Freq: Every day | ORAL | 0 refills | Status: DC

## 2018-06-07 NOTE — Progress Notes (Signed)
Virtual Visit via Telephone Note  I connected with Brooke Randall on 06/07/18 at  1:20 PM EDT by telephone and verified that I am speaking with the correct person using two identifiers.   Staff discussed the limitations, risks, security and privacy concerns of performing an evaluation and management service by telephone and the availability of in person appointments. Staff also discussed with the patient that there may be a patient responsible charge related to this service. The patient expressed understanding and agreed to proceed.  Patients location: home  My location: home office  Other people in meeting: none    HPI  Hypertension Blood pressure readings since last visit 150's/80's, states lowest reading was 137/78; highest reading was 165/89 Patient is rx telmisartan-HCTZ 80-12.5mg  daily Added on amlodipine 5mg  last month. Denies dizziness, lightheadedness, chest pain, blurry vision  BP Readings from Last 3 Encounters:  06/07/18 (!) 142/84  05/17/18 (!) 163/84  04/30/18 (!) 170/90   Diabetes Fasting blood sugars 140-150's Taking metformin 1000mg  BID, glipizide 5-10mg - states she takes glipizide BID- and sometimes forgets to take it at night time.  Denies headache, polyuria, polydipsia, polyphagia Neuropathy at baseline On ARB, unable to tolerate statins  Hyperlipidemia Unable to afford vascepa, Lab Results  Component Value Date   CHOL 189 04/09/2018   HDL 34 (L) 04/09/2018   LDLCALC 108 (H) 04/09/2018   TRIG 233 (H) 04/09/2018   CHOLHDL 5.6 04/09/2018     PHQ2/9: Depression screen [facility] 2/9 06/07/2018 05/17/2018 04/12/2018 04/08/2018 10/26/2017  Decreased Interest 0 0 0 0 0  Down, Depressed, Hopeless 0 0 1 1 0  PHQ - 2 Score 0 0 1 1 0  Altered sleeping 0 0 1 - 0  Tired, decreased energy 0 0 1 - 1  Change in appetite 0 0 0 - 0  Feeling bad or failure about yourself  0 0 0 - 0  Trouble concentrating 0 0 0 - 0  Moving slowly or fidgety/restless 0 0 0 - 0  Suicidal thoughts  0 0 0 - 0  PHQ-9 Score 0 0 3 - 1  Difficult doing work/chores Not difficult at all Not difficult at all Somewhat difficult - Somewhat difficult  Some recent data might be hidden    PHQ reviewed. Negative- doing well in pandemic  Patient Active Problem List   Diagnosis Date Noted  . Angiomyolipoma 04/16/2018  . Fatty liver 04/16/2018  . Calculus of gallbladder with chronic cholecystitis without obstruction 04/16/2018  . Injury of tendon of long head of left biceps 10/05/2017  . Nontraumatic incomplete tear of left rotator cuff 10/05/2017  . Rotator cuff tendinitis, left 10/05/2017  . Degenerative tear of glenoid labrum of left shoulder 10/05/2017  . Arthropathy of spinal facet joint 06/05/2017  . Osteoarthritis of knee 06/05/2017  . Diabetic retinopathy associated with controlled type 2 diabetes mellitus ([place]) 06/05/2017  . Current moderate episode of major depressive disorder without prior episode ([place]) 05/14/2017  . Impingement syndrome of left shoulder region 03/22/2017  . Mass of right hand 04/21/2016  . Post-menopausal 03/31/2016  . Hyperlipidemia 12/17/2014  . Well controlled type 2 diabetes mellitus with peripheral neuropathy ([place]) 12/17/2014  . Hypertension 08/20/2014  . Vitamin D deficiency 08/20/2014  . Lumbar disc herniation with radiculopathy 09/12/2012    Past Medical History:  Diagnosis Date  . Arthritis   . Diabetes mellitus without complication ([place])   . Hyperlipidemia   . Hypertension   . Nausea & vomiting   . Osteoporosis  Past Surgical History:  Procedure Laterality Date  . DENTAL SURGERY  04/25/2018  . FOOT SURGERY Right 1999    Social History   Tobacco Use  . Smoking status: Never Smoker  . Smokeless tobacco: Never Used  Substance Use Topics  . Alcohol use: No    Alcohol/week: 0.0 standard drinks     Current Outpatient Medications:  .  amLODipine (NORVASC) 5 MG tablet, Take 1 tablet (5 mg total) by mouth daily., Disp: 90 tablet, Rfl:  0 .  Calcium Carbonate-Vit D-Min (CALCIUM/VITAMIN D/MINERALS) 600-200 MG-UNIT TABS, Take 1 tablet by mouth daily., Disp: , Rfl:  .  DULoxetine (CYMBALTA) 60 MG capsule, Take 1 capsule by mouth once daily, Disp: 90 capsule, Rfl: 0 .  glipiZIDE (GLUCOTROL XL) 5 MG 24 hr tablet, Take 2 tablets (10 mg total) by mouth daily with breakfast., Disp: 60 tablet, Rfl: 2 .  glucose blood (ONE TOUCH ULTRA TEST) test strip, BID, Disp: 100 each, Rfl: 12 .  Icosapent Ethyl 1 g CAPS, Take 2 capsules (2 g total) by mouth 2 (two) times daily., Disp: 120 capsule, Rfl: 2 .  Lancets (ONETOUCH ULTRASOFT) lancets, Use as instructed, Disp: 100 each, Rfl: 12 .  metFORMIN (GLUCOPHAGE XR) 500 MG 24 hr tablet, Take 2 tablets (1,000 mg total) by mouth 2 (two) times daily., Disp: 120 tablet, Rfl: 2 .  telmisartan-hydrochlorothiazide (MICARDIS HCT) 80-12.5 MG tablet, Take 1 tablet by mouth daily., Disp: , Rfl:   Allergies  Allergen Reactions  . Sulfa Antibiotics   . Statins     Muscle aches    ROS  No other specific complaints in a complete review of systems (except as listed in HPI above).  Objective  Vitals:   06/07/18 1114  BP: (!) 142/84     There is no height or weight on file to calculate BMI.  Patient is alert, able to speak in full sentences without difficulty.   Assessment & Plan  1. Uncontrolled hypertension Will increase amlodipine to 10mg  daily and recheck in one month.  - telmisartan-hydrochlorothiazide (MICARDIS HCT) 80-12.5 MG tablet; Take 1 tablet by mouth daily.  Dispense: 90 tablet; Refill: 0 - amLODipine (NORVASC) 10 MG tablet; Take 0.5 tablets (5 mg total) by mouth daily.  Dispense: 90 tablet; Refill: 0  2. Well controlled type 2 diabetes mellitus with peripheral neuropathy ([place]) Discussed diet, how to take medications, prevention of hypoglycemia  - omega-3 acid ethyl esters (LOVAZA) 1 g capsule; Take 1 capsule (1 g total) by mouth 2 (two) times daily.  Dispense: 180 capsule; Refill:  0  3. Dyslipidemia associated with type 2 diabetes mellitus ([facility]) - omega-3 acid ethyl esters (LOVAZA) 1 g capsule; Take 1 capsule (1 g total) by mouth 2 (two) times daily.  Dispense: 180 capsule; Refill: 0  4. Elevated triglycerides with high cholesterol - omega-3 acid ethyl esters (LOVAZA) 1 g capsule; Take 1 capsule (1 g total) by mouth 2 (two) times daily.  Dispense: 180 capsule; Refill: 0  5. Depression, major, single episode, mild ([place]) Doing well, continue medications   I provided 26 minutes of non-face-to-face time during this encounter.   Fredderick Severance, NP

## 2018-06-25 DIAGNOSIS — R69 Illness, unspecified: Secondary | ICD-10-CM | POA: Diagnosis not present

## 2018-06-26 DIAGNOSIS — R69 Illness, unspecified: Secondary | ICD-10-CM | POA: Diagnosis not present

## 2018-07-05 DIAGNOSIS — Z1231 Encounter for screening mammogram for malignant neoplasm of breast: Secondary | ICD-10-CM | POA: Diagnosis not present

## 2018-07-05 LAB — HM MAMMOGRAPHY: HM Mammogram: NORMAL (ref 0–4)

## 2018-07-09 DIAGNOSIS — E113393 Type 2 diabetes mellitus with moderate nonproliferative diabetic retinopathy without macular edema, bilateral: Secondary | ICD-10-CM | POA: Diagnosis not present

## 2018-07-09 LAB — HM DIABETES EYE EXAM

## 2018-07-17 ENCOUNTER — Ambulatory Visit (INDEPENDENT_AMBULATORY_CARE_PROVIDER_SITE_OTHER): Payer: Medicare HMO | Admitting: Family Medicine

## 2018-07-17 ENCOUNTER — Other Ambulatory Visit: Payer: Self-pay

## 2018-07-17 ENCOUNTER — Other Ambulatory Visit: Payer: Self-pay | Admitting: Family Medicine

## 2018-07-17 ENCOUNTER — Encounter: Payer: Self-pay | Admitting: Family Medicine

## 2018-07-17 VITALS — BP 128/68 | HR 106 | Temp 98.0°F | Resp 16 | Ht 62.5 in | Wt 129.0 lb

## 2018-07-17 DIAGNOSIS — M791 Myalgia, unspecified site: Secondary | ICD-10-CM

## 2018-07-17 DIAGNOSIS — E785 Hyperlipidemia, unspecified: Secondary | ICD-10-CM

## 2018-07-17 DIAGNOSIS — E1142 Type 2 diabetes mellitus with diabetic polyneuropathy: Secondary | ICD-10-CM

## 2018-07-17 DIAGNOSIS — I152 Hypertension secondary to endocrine disorders: Secondary | ICD-10-CM

## 2018-07-17 DIAGNOSIS — E1169 Type 2 diabetes mellitus with other specified complication: Secondary | ICD-10-CM | POA: Diagnosis not present

## 2018-07-17 DIAGNOSIS — I10 Essential (primary) hypertension: Secondary | ICD-10-CM | POA: Diagnosis not present

## 2018-07-17 DIAGNOSIS — M810 Age-related osteoporosis without current pathological fracture: Secondary | ICD-10-CM | POA: Diagnosis not present

## 2018-07-17 DIAGNOSIS — R000 Tachycardia, unspecified: Secondary | ICD-10-CM | POA: Diagnosis not present

## 2018-07-17 DIAGNOSIS — G4700 Insomnia, unspecified: Secondary | ICD-10-CM

## 2018-07-17 DIAGNOSIS — F325 Major depressive disorder, single episode, in full remission: Secondary | ICD-10-CM | POA: Diagnosis not present

## 2018-07-17 DIAGNOSIS — K760 Fatty (change of) liver, not elsewhere classified: Secondary | ICD-10-CM | POA: Diagnosis not present

## 2018-07-17 DIAGNOSIS — E782 Mixed hyperlipidemia: Secondary | ICD-10-CM | POA: Diagnosis not present

## 2018-07-17 DIAGNOSIS — E1159 Type 2 diabetes mellitus with other circulatory complications: Secondary | ICD-10-CM

## 2018-07-17 DIAGNOSIS — R69 Illness, unspecified: Secondary | ICD-10-CM | POA: Diagnosis not present

## 2018-07-17 DIAGNOSIS — M8580 Other specified disorders of bone density and structure, unspecified site: Secondary | ICD-10-CM

## 2018-07-17 DIAGNOSIS — T466X5A Adverse effect of antihyperlipidemic and antiarteriosclerotic drugs, initial encounter: Secondary | ICD-10-CM | POA: Insufficient documentation

## 2018-07-17 LAB — POCT GLYCOSYLATED HEMOGLOBIN (HGB A1C): HbA1c, POC (controlled diabetic range): 6.4 % (ref 0.0–7.0)

## 2018-07-17 MED ORDER — GLIPIZIDE ER 2.5 MG PO TB24: 7.5000 mg | ORAL_TABLET | Freq: Every day | ORAL | 1 refills | Status: AC

## 2018-07-17 MED ORDER — OMEGA-3-ACID ETHYL ESTERS 1 G PO CAPS: 1.0000 g | ORAL_CAPSULE | Freq: Two times a day (BID) | ORAL | 0 refills | Status: AC

## 2018-07-17 MED ORDER — METFORMIN HCL ER 500 MG PO TB24: 1000.0000 mg | ORAL_TABLET | Freq: Two times a day (BID) | ORAL | 1 refills | Status: DC

## 2018-07-17 MED ORDER — TELMISARTAN-HCTZ 80-12.5 MG PO TABS: 1.0000 | ORAL_TABLET | Freq: Every day | ORAL | 0 refills | Status: AC

## 2018-07-17 MED ORDER — DULOXETINE HCL 60 MG PO CPEP: 60.0000 mg | ORAL_CAPSULE | Freq: Every day | ORAL | 1 refills | Status: AC

## 2018-07-17 MED ORDER — AMLODIPINE BESYLATE 5 MG PO TABS: 5.0000 mg | ORAL_TABLET | Freq: Every day | ORAL | 1 refills | Status: AC

## 2018-07-17 NOTE — Telephone Encounter (Signed)
Copied from [place] [id number]. Topic: Quick Communication - Rx Refill/Question >> Jul 17, 2018  3:15 PM Erick Blinks wrote: Medication: metFORMIN (GLUCOPHAGE XR) 500 MG 24 hr tablet [[id number]] -Discontinued. Crystal from pharmacy requesting fu to replace prescription. All XR tablets are on recall  Has the patient contacted their pharmacy? Yes (Agent: If no, request that the patient contact the pharmacy for the refill.) (Agent: If yes, when and what did the pharmacy advise?)  Preferred Pharmacy (with phone number or street name): [facility] [address] [place] - [place] [address] Phone: [phone] Fax: [phone]    Agent: Please be advised that RX refills may take up to 3 business days. We ask that you follow-up with your pharmacy.

## 2018-07-17 NOTE — Progress Notes (Signed)
Name: Brooke Randall   MRN: [id number]    DOB: 1950/03/30   Date:07/17/2018       Progress Note  Subjective  Chief Complaint  Chief Complaint  Patient presents with  . Diabetes  . Depression  . Medication Refill  . Impingement syndrome  . Hypertension    HPI  Major Depression: retired in 2014, however children moved out of town, family is in [place] and she feels lonely and overwhelmed having to make all the decisions. She is doing well on duloxetine 60 mg daily. She bought a house in [place]   HTN: taking medications, denies side effects, no chest pain or palpitation.. BP at home still goes to 140-150's, heart rate slightly above 100, we will check TSH, bp at goal today, she is on 5 mg of norvasc and telmisartan hctz   DMII: on ARB, she has retinopathy, neuropathy and dyslipidemia. Shedid not start Zetia she states she thinks it was too expensive, she tried statin therapy but causes muscle pain.  She is off Gabapentin again because it made her feel very sleepy, but doing well on duloxetine .  She is now on glipizide 5 mg BID and A1C has gone down from 7.6% to 6.4%, she denies hypoglycemic episodes, glucose has been in the 120's fasting, she is worried about hypoglycemia and we will try 5 mg in am and 2.5 mg at night and monitor it. Up to date with eye exam  Impingement syndrome: seeing Dr. Mack Guise and Dr. Sabra Heck, had injection but still has pain. Pain is worse at night. She had MRI that showed multiple tears and needs surgery but she is worried about recovery. She states she has been doing better.   Patient Active Problem List   Diagnosis Date Noted  . Myalgia due to statin 07/17/2018  . Angiomyolipoma 04/16/2018  . Fatty liver 04/16/2018  . Calculus of gallbladder with chronic cholecystitis without obstruction 04/16/2018  . Injury of tendon of long head of left biceps 10/05/2017  . Nontraumatic incomplete tear of left rotator cuff 10/05/2017  . Rotator cuff tendinitis,  left 10/05/2017  . Degenerative tear of glenoid labrum of left shoulder 10/05/2017  . Arthropathy of spinal facet joint 06/05/2017  . Osteoarthritis of knee 06/05/2017  . Diabetic retinopathy associated with controlled type 2 diabetes mellitus ([place]) 06/05/2017  . Current moderate episode of major depressive disorder without prior episode ([place]) 05/14/2017  . Impingement syndrome of left shoulder region 03/22/2017  . Mass of right hand 04/21/2016  . Post-menopausal 03/31/2016  . Hyperlipidemia 12/17/2014  . Well controlled type 2 diabetes mellitus with peripheral neuropathy ([place]) 12/17/2014  . Hypertension 08/20/2014  . Vitamin D deficiency 08/20/2014  . Lumbar disc herniation with radiculopathy 09/12/2012    Past Surgical History:  Procedure Laterality Date  . DENTAL SURGERY  04/25/2018  . FOOT SURGERY Right 1999    Family History  Problem Relation Age of Onset  . Hypertension Mother   . Hypertension Father   . Diabetes Father   . Hypertension Sister   . Diabetes Sister   . Hypertension Brother     Social History   Socioeconomic History  . Marital status: Divorced    Spouse name: Not on file  . Number of children: 2  . Years of education: Not on file  . Highest education level: Master's degree (e.g., MA, MS, MEng, MEd, MSW, MBA)  Occupational History  . Occupation: Unemployed    Comment: Looking for work  Social Needs  . Financial  resource strain: Not hard at all  . Food insecurity:    Worry: Never true    Inability: Never true  . Transportation needs:    Medical: No    Non-medical: No  Tobacco Use  . Smoking status: Never Smoker  . Smokeless tobacco: Never Used  Substance and Sexual Activity  . Alcohol use: No    Alcohol/week: 0.0 standard drinks  . Drug use: No  . Sexual activity: Not Currently    Birth control/protection: Post-menopausal  Lifestyle  . Physical activity:    Days per week: 0 days    Minutes per session: 0 min  . Stress: To some extent   Relationships  . Social connections:    Talks on phone: More than three times a week    Gets together: Once a week    Attends religious service: Never    Active member of club or organization: No    Attends meetings of clubs or organizations: Never    Relationship status: Divorced  . Intimate partner violence:    Fear of current or ex partner: No    Emotionally abused: No    Physically abused: No    Forced sexual activity: No  Other Topics Concern  . Not on file  Social History Narrative   Retired, empty nested. No family around. She is Muslim     Current Outpatient Medications:  .  amLODipine (NORVASC) 5 MG tablet, Take 1 tablet (5 mg total) by mouth daily., Disp: 90 tablet, Rfl: 1 .  Calcium Carbonate-Vit D-Min (CALCIUM/VITAMIN D/MINERALS) 600-200 MG-UNIT TABS, Take 1 tablet by mouth daily., Disp: , Rfl:  .  DULoxetine (CYMBALTA) 60 MG capsule, Take 1 capsule (60 mg total) by mouth daily., Disp: 90 capsule, Rfl: 1 .  glipiZIDE (GLUCOTROL XL) 2.5 MG 24 hr tablet, Take 3-4 tablets (7.5-10 mg total) by mouth daily with breakfast., Disp: 360 tablet, Rfl: 1 .  glucose blood (ONE TOUCH ULTRA TEST) test strip, BID, Disp: 100 each, Rfl: 12 .  Lancets (ONETOUCH ULTRASOFT) lancets, Use as instructed, Disp: 100 each, Rfl: 12 .  metFORMIN (GLUCOPHAGE XR) 500 MG 24 hr tablet, Take 2 tablets (1,000 mg total) by mouth 2 (two) times daily., Disp: 480 tablet, Rfl: 1 .  omega-3 acid ethyl esters (LOVAZA) 1 g capsule, Take 1 capsule (1 g total) by mouth 2 (two) times daily., Disp: 180 capsule, Rfl: 0 .  telmisartan-hydrochlorothiazide (MICARDIS HCT) 80-12.5 MG tablet, Take 1 tablet by mouth daily., Disp: 90 tablet, Rfl: 0  Allergies  Allergen Reactions  . Sulfa Antibiotics   . Statins     Muscle aches    I personally reviewed active problem list, medication list, allergies, family history, social history with the patient/caregiver today.   ROS  Constitutional: Negative for fever or weight  change.  Respiratory: Negative for cough and shortness of breath.   Cardiovascular: Negative for chest pain or palpitations.  Gastrointestinal: Negative for abdominal pain, no bowel changes.  Musculoskeletal: Negative for gait problem or joint swelling.  Skin: Negative for rash.  Neurological: Negative for dizziness or headache.  No other specific complaints in a complete review of systems (except as listed in HPI above).  Objective  Vitals:   07/17/18 1351  BP: 128/68  Pulse: (!) 106  Resp: 16  Temp: 98 F (36.7 C)  TempSrc: Oral  SpO2: 99%  Weight: 129 lb (58.5 kg)  Height: 5' 2.5" (1.588 m)    Body mass index is 23.22 kg/m.  Physical Exam  Constitutional: Patient appears well-developed and well-nourished.  No distress.  HEENT: head atraumatic, neck supple, throat within normal limits, wearing surgical mask  Cardiovascular: Normal rate, regular rhythm and normal heart sounds.  No murmur heard. No BLE edema. Pulmonary/Chest: Effort normal and breath sounds normal. No respiratory distress. Abdominal: Soft.  There is no tenderness. Psychiatric: Patient has a normal mood and affect. behavior is normal. Judgment and thought content normal.  Recent Results (from the past 2160 hour(s))  HM MAMMOGRAPHY     Status: None   Collection Time: 07/05/18 12:00 AM  Result Value Ref Range   HM Mammogram Self Reported Normal 0-4 Bi-Rad, Self Reported Normal    Comment: Not self reported see Media for scanned results.  HM DIABETES EYE EXAM     Status: Abnormal   Collection Time: 07/09/18 12:00 AM  Result Value Ref Range   HM Diabetic Eye Exam Retinopathy (A) No Retinopathy    Comment: Background Diabetic Retinopathy--No treatment needed  POCT HgB A1C     Status: Normal   Collection Time: 07/17/18  2:13 PM  Result Value Ref Range   Hemoglobin A1C     HbA1c POC (<> result, manual entry)     HbA1c, POC (prediabetic range)     HbA1c, POC (controlled diabetic range) 6.4 0.0 - 7.0 %     Diabetic Foot Exam: Diabetic Foot Exam - Simple   Simple Foot Form Diabetic Foot exam was performed with the following findings:  Yes 07/17/2018  2:10 PM  Visual Inspection See comments:  Yes Sensation Testing See comments:  Yes Pulse Check Posterior Tibialis and Dorsalis pulse intact bilaterally:  Yes Comments Thick first toe nail right foot, failed sensation on left foot       PHQ2/9: Depression screen [facility] 2/9 07/17/2018 06/07/2018 05/17/2018 04/12/2018 04/08/2018  Decreased Interest 0 0 0 0 0  Down, Depressed, Hopeless 0 0 0 1 1  PHQ - 2 Score 0 0 0 1 1  Altered sleeping 0 0 0 1 -  Tired, decreased energy 1 0 0 1 -  Change in appetite 0 0 0 0 -  Feeling bad or failure about yourself  0 0 0 0 -  Trouble concentrating 0 0 0 0 -  Moving slowly or fidgety/restless 0 0 0 0 -  Suicidal thoughts 0 0 0 0 -  PHQ-9 Score 1 0 0 3 -  Difficult doing work/chores Somewhat difficult Not difficult at all Not difficult at all Somewhat difficult -  Some recent data might be hidden    phq 9 is negative   Fall Risk: Fall Risk  07/17/2018 06/07/2018 05/17/2018 04/30/2018 04/12/2018  Falls in the past year? 0 0 0 0 0  Number falls in past yr: 0 0 0 - 0  Injury with Fall? 0 0 0 - 0  Follow up - Falls evaluation completed - - -     Functional Status Survey: Is the patient deaf or have difficulty hearing?: No Does the patient have difficulty seeing, even when wearing glasses/contacts?: Yes Does the patient have difficulty concentrating, remembering, or making decisions?: No Does the patient have difficulty walking or climbing stairs?: No Does the patient have difficulty dressing or bathing?: No Does the patient have difficulty doing errands alone such as visiting a doctor's office or shopping?: No   Assessment & Plan  1. Well controlled type 2 diabetes mellitus with peripheral neuropathy (HCC)  - POCT HgB A1C - Urine Microalbumin w/creat. ratio - metFORMIN (GLUCOPHAGE XR) 500  MG 24 hr  tablet; Take 2 tablets (1,000 mg total) by mouth 2 (two) times daily.  Dispense: 480 tablet; Refill: 1 - glipiZIDE (GLUCOTROL XL) 2.5 MG 24 hr tablet; Take 3-4 tablets (7.5-10 mg total) by mouth daily with breakfast.  Dispense: 360 tablet; Refill: 1 - omega-3 acid ethyl esters (LOVAZA) 1 g capsule; Take 1 capsule (1 g total) by mouth 2 (two) times daily.  Dispense: 180 capsule; Refill: 0  2. Dyslipidemia associated with type 2 diabetes mellitus (HCC)  - glipiZIDE (GLUCOTROL XL) 2.5 MG 24 hr tablet; Take 3-4 tablets (7.5-10 mg total) by mouth daily with breakfast.  Dispense: 360 tablet; Refill: 1 - omega-3 acid ethyl esters (LOVAZA) 1 g capsule; Take 1 capsule (1 g total) by mouth 2 (two) times daily.  Dispense: 180 capsule; Refill: 0  3. Major depression in remission (HCC)  - DULoxetine (CYMBALTA) 60 MG capsule; Take 1 capsule (60 mg total) by mouth daily.  Dispense: 90 capsule; Refill: 1  4. Fatty liver   5. Hypertension associated with diabetes ([place])  - telmisartan-hydrochlorothiazide (MICARDIS HCT) 80-12.5 MG tablet; Take 1 tablet by mouth daily.  Dispense: 90 tablet; Refill: 0 - amLODipine (NORVASC) 5 MG tablet; Take 1 tablet (5 mg total) by mouth daily.  Dispense: 90 tablet; Refill: 1  6. Hypertension, benign  Well controlled now   62. Elevated triglycerides with high cholesterol  - omega-3 acid ethyl esters (LOVAZA) 1 g capsule; Take 1 capsule (1 g total) by mouth 2 (two) times daily.  Dispense: 180 capsule; Refill: 0  8. Tachycardia  - TSH  9. Osteopenia after menopause  - DG Bone Density; Future - TSH

## 2018-07-18 LAB — TSH: TSH: 2.86 mIU/L (ref 0.40–4.50)

## 2018-07-18 LAB — MICROALBUMIN / CREATININE URINE RATIO
Creatinine, Urine: 111 mg/dL (ref 20–275)
Microalb Creat Ratio: 23 mcg/mg creat (ref <30)
Microalb, Ur: 2.5 mg/dL

## 2018-07-19 ENCOUNTER — Telehealth: Payer: Self-pay | Admitting: Family Medicine

## 2018-07-19 ENCOUNTER — Telehealth: Payer: Self-pay

## 2018-07-19 DIAGNOSIS — Z780 Asymptomatic menopausal state: Secondary | ICD-10-CM

## 2018-07-19 DIAGNOSIS — M8580 Other specified disorders of bone density and structure, unspecified site: Secondary | ICD-10-CM

## 2018-07-19 NOTE — Telephone Encounter (Signed)
Copied from [place] [phone]. Topic: Quick Communication - Rx Refill/Question >> Jul 19, 2018  2:35 PM Gustavus Messing wrote: Medication: metFORMIN (GLUCOPHAGE) 1000 MG tablet   Has the patient contacted their pharmacy? Yes.   (Agent: If yes, when and what did the pharmacy advise?) The patient was prescribed rapid realease tablets but tey have been recalled. She urgently needs this medication.  Preferred Pharmacy (with phone number or street name): [facility] [address] [place] - [place] [phone] (Phone) [phone] (Fax)    Agent: Please be advised that RX refills may take up to 3 business days. We ask that you follow-up with your pharmacy.

## 2018-07-19 NOTE — Telephone Encounter (Signed)
Copied from [place] [phone]. Topic: General - Inquiry >> Jul 19, 2018 12:40 PM Mathis Bud wrote: Reason for CRM: Nckay called from [facility] called to get a bone density order. Please Fax.  [facility] : [id number], [place] [address] Call back [phone] Fax number [phone]   Not sure if you need to place order in system. Paperwork has been placed in green folder please sign.

## 2018-07-19 NOTE — Telephone Encounter (Signed)
Copied from [place] [phone]. Topic: Quick Communication - Rx Refill/Question >> Jul 19, 2018  2:35 PM Gustavus Messing wrote: Medication: metFORMIN (GLUCOPHAGE) 1000 MG tablet   Has the patient contacted their pharmacy? Yes.   (Agent: If yes, when and what did the pharmacy advise?) The patient was prescribed rapid realease tablets but tey have been recalled. She urgently needs this medication.  Preferred Pharmacy (with phone number or street name): [facility] [address], [place] - [place] [phone] (Phone) [phone] (Fax)    Agent: Please be advised that RX refills may take up to 3 business days. We ask that you follow-up with your pharmacy.

## 2018-07-22 ENCOUNTER — Telehealth: Payer: Self-pay | Admitting: *Deleted

## 2018-07-22 NOTE — Telephone Encounter (Signed)
Copied from [place] [phone]. Topic: Quick Communication - Rx Refill/Question >> Jul 19, 2018  2:35 PM Gustavus Messing wrote: Medication: metFORMIN (GLUCOPHAGE) 1000 MG tablet   Has the patient contacted their pharmacy? Yes.   (Agent: If yes, when and what did the pharmacy advise?) The patient was prescribed rapid realease tablets but tey have been recalled. She urgently needs this medication.  Preferred Pharmacy (with phone number or street name): [facility] [address] [place] - [place] [phone] (Phone) [phone] (Fax)    Agent: Please be advised that RX refills may take up to 3 business days. We ask that you follow-up with your pharmacy. >> Jul 19, 2018  4:21 PM Pearson, Rexene Edison, RN wrote: This CRM was sent back to the [facility] pool incorrectly.  Since COVID, practices are handling their own prescriptions when patients call in to request.  This prescription request needs to be handled by the practice.  Thank you.

## 2018-07-24 ENCOUNTER — Telehealth: Payer: Self-pay | Admitting: Family Medicine

## 2018-07-24 NOTE — Telephone Encounter (Signed)
Copied from [place] [phone]. Topic: Quick Communication - Rx Refill/Question >> Jul 24, 2018  3:00 PM Celene Kras A wrote: Medication: Extended release Metformin has been recalled . Pt states she needs another sent in to pharmacy. Pt states she has called in multiple times to get this taken care of. Pt states she has two pills left. Please advise.   Has the patient contacted their pharmacy? Yes.   (Agent: If no, request that the patient contact the pharmacy for the refill.) (Agent: If yes, when and what did the pharmacy advise?)  Preferred Pharmacy (with phone number or street name): [facility] [place], [place] - [address] [place] [address] Phone: [phone] Fax: [phone] Not a 24 hour pharmacy; exact hours not known.    Agent: Please be advised that RX refills may take up to 3 business days. We ask that you follow-up with your pharmacy.

## 2018-07-25 DIAGNOSIS — Z780 Asymptomatic menopausal state: Secondary | ICD-10-CM | POA: Diagnosis not present

## 2018-07-25 DIAGNOSIS — M8588 Other specified disorders of bone density and structure, other site: Secondary | ICD-10-CM | POA: Diagnosis not present

## 2018-07-25 MED ORDER — METFORMIN HCL 1000 MG PO TABS: 1000.0000 mg | ORAL_TABLET | Freq: Two times a day (BID) | ORAL | 1 refills | Status: AC

## 2018-08-07 ENCOUNTER — Telehealth: Payer: Self-pay

## 2018-08-07 NOTE — Telephone Encounter (Signed)
Copied from [place] [phone]. Topic: General - Inquiry >> Aug 06, 2018  4:22 PM Brooke Randall wrote: Reason for CRM:   Pt is still waiting to hear the results of her bone density scan.  States that she called the imaging facility and they told her results were sent to PCP same day.

## 2018-08-07 NOTE — Telephone Encounter (Signed)
She had bone density done at [facility]. No records.

## 2018-08-07 NOTE — Telephone Encounter (Signed)
Pt called stating that she got back into [facility] and they are sending the fax of her bone density records today. Please advise.

## 2018-08-09 NOTE — Telephone Encounter (Signed)
Called and left vm for patient about results of DEXA scan.

## 2018-09-13 DIAGNOSIS — Z0100 Encounter for examination of eyes and vision without abnormal findings: Secondary | ICD-10-CM | POA: Diagnosis not present

## 2018-10-20 ENCOUNTER — Other Ambulatory Visit: Payer: Self-pay | Admitting: Family Medicine

## 2018-10-20 DIAGNOSIS — R69 Illness, unspecified: Secondary | ICD-10-CM | POA: Diagnosis not present

## 2018-10-24 DIAGNOSIS — R69 Illness, unspecified: Secondary | ICD-10-CM | POA: Diagnosis not present

## 2018-11-05 DIAGNOSIS — R69 Illness, unspecified: Secondary | ICD-10-CM | POA: Diagnosis not present

## 2018-11-08 ENCOUNTER — Telehealth: Payer: Self-pay

## 2018-11-08 ENCOUNTER — Other Ambulatory Visit: Payer: Self-pay | Admitting: Family Medicine

## 2018-11-08 DIAGNOSIS — E1142 Type 2 diabetes mellitus with diabetic polyneuropathy: Secondary | ICD-10-CM

## 2018-11-08 NOTE — Telephone Encounter (Signed)
Pt states she will come for a 6 mo fup but at least wants her A1C checked every 3 months if she could get that order

## 2018-11-08 NOTE — Telephone Encounter (Signed)
Requested medication (s) are due for refill today: no  Requested medication (s) are on the active medication list:no  Last refill: 06/05/2017  Future visit scheduled: no  Notes to clinic: review for refill Medication was discontinued    Requested Prescriptions  Pending Prescriptions Disp Refills   gabapentin (NEURONTIN) 100 MG capsule [Pharmacy Med Name: Gabapentin 100 MG Oral Capsule] 90 capsule 0    Sig: TAKE 1 TO 3 CAPSULES BY MOUTH AT BEDTIME     Neurology: Anticonvulsants - gabapentin Passed - 11/08/2018  1:11 PM      Passed - Valid encounter within last 12 months    Recent Outpatient Visits          3 months ago Well controlled type 2 diabetes mellitus with peripheral neuropathy [facility])   [facility] Steele Sizer, MD   5 months ago Uncontrolled hypertension   [place], NP   5 months ago Dyslipidemia associated with type 2 diabetes mellitus [facility])   [place], NP   6 months ago Pruritic rash   [place], NP   7 months ago RUQ pain   [place], NP

## 2018-11-08 NOTE — Telephone Encounter (Signed)
Patient was seen here for 07/17/2018 and needs a follow up around 01/16/2019. Also left a message her A1C during her visit on 07/17/2018 was 6.4.

## 2018-11-08 NOTE — Telephone Encounter (Signed)
Copied from [place] [phone]. Topic: General - Inquiry >> Nov 07, 2018  2:53 PM Reyne Dumas L wrote: Reason for CRM:   Pt wants to know when last A1C was and if she is due for her next check.

## 2018-11-08 NOTE — Telephone Encounter (Signed)
Copied from [employer] [phone]. Topic: General - Inquiry >> Nov 07, 2018  2:53 PM Reyne Dumas L wrote: Reason for CRM:   Pt wants to know when last A1C was and if she is due for her next check.

## 2019-09-11 ENCOUNTER — Other Ambulatory Visit: Payer: Self-pay | Admitting: Family Medicine

## 2019-09-11 DIAGNOSIS — Z780 Asymptomatic menopausal state: Secondary | ICD-10-CM

## 2019-09-11 DIAGNOSIS — M8580 Other specified disorders of bone density and structure, unspecified site: Secondary | ICD-10-CM

## 2020-10-10 IMAGING — RF UPPER GI SERIES (WITHOUT KUB)
11 of 12 series · 14 of 24 positions shown · non-contrast
Comparison: None.

CLINICAL DATA: Nausea and vomiting

EXAM:
UPPER GI SERIES WITHOUT KUB
TECHNIQUE: Routine upper GI series was performed with thick barium. Barium
impregnated tablet was administered as well.
FLUOROSCOPY TIME:  Fluoroscopy Time:  1 minutes 54 seconds
Radiation Exposure Index (if provided by the fluoroscopic device):
33.6 mGy
Number of Acquired Spot Images: 7 spot films and multiple cine
fluoroscopic runs

[Series 1: cp_standard · 0.17mm/px · 2 of 85 frames shown (1 of 8)]
[frame 13/85]
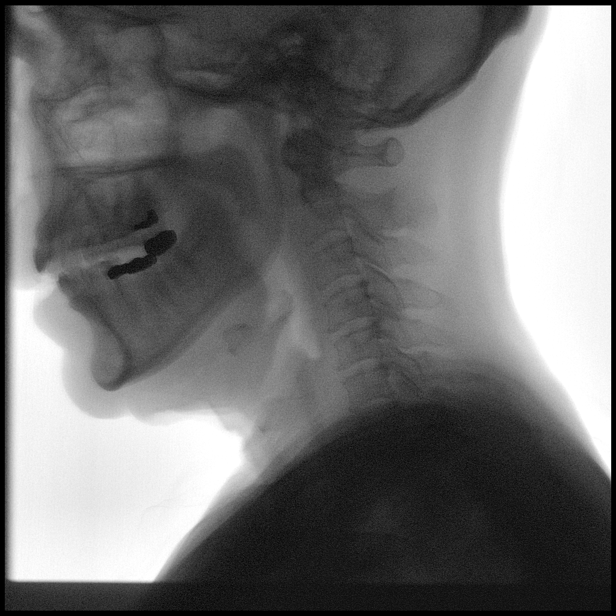
[frame 73/85]
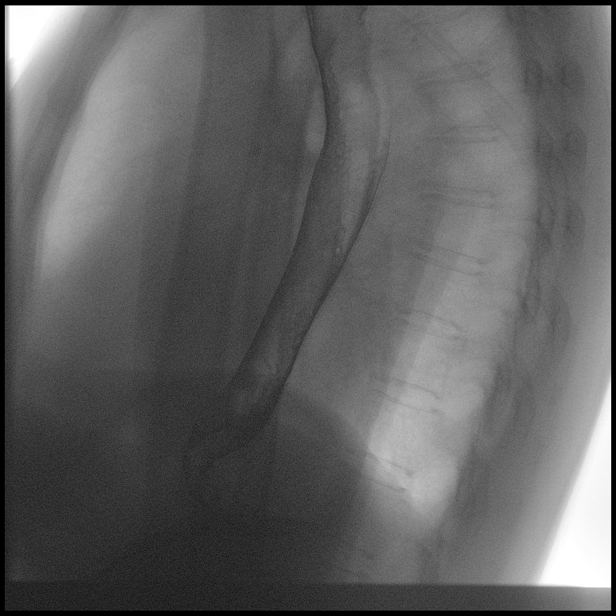

[Series 2: cp_standard · 0.17mm/px · 1 of 65 frames shown (2 of 8)]
[frame 33/65]
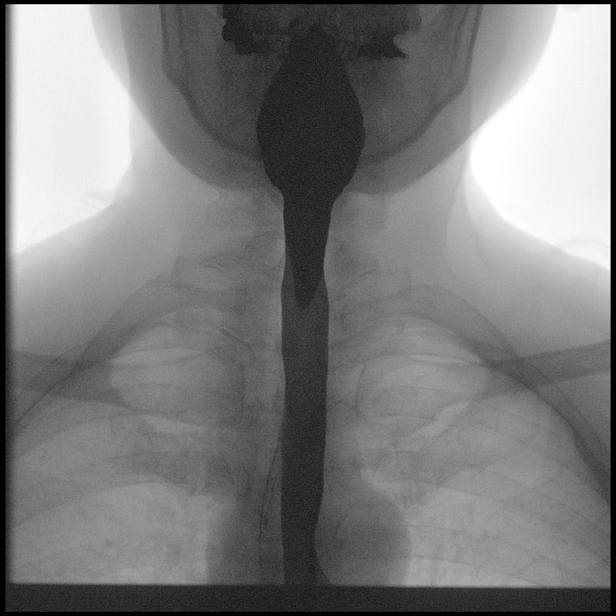

[Series 3: fluoro_barium 2fps_bw · 0.17mm/px · 1 of 3 frames shown (1 of 3)]
[frame 2/3]
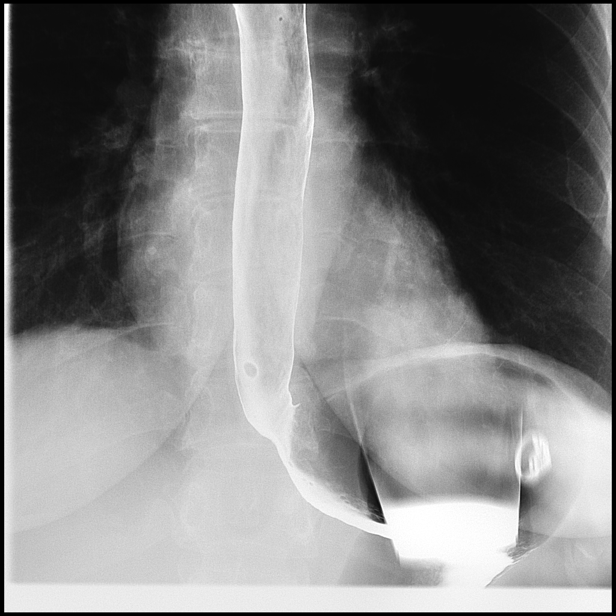

[Series 4: fluoro_barium 2fps_bw · 0.18mm/px · 1 of 1 slices shown (2 of 3)]
[im 1/1]
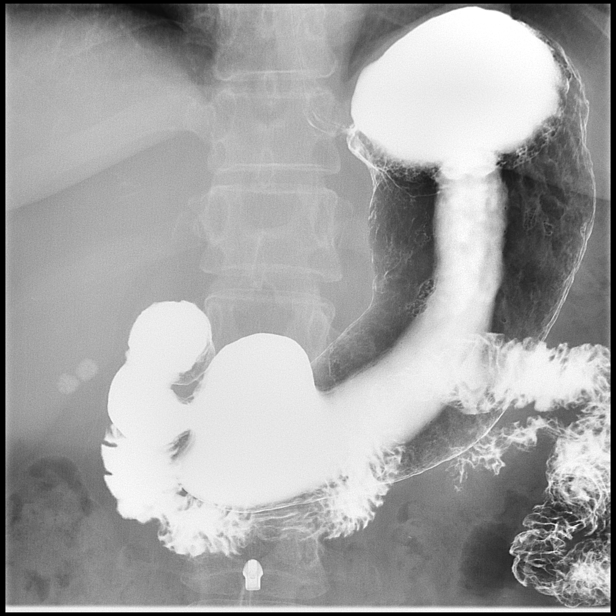

[Series 6: cp_standard · 0.09mm/px · 2 of 26 frames shown (3 of 8)]
[frame 4/26]
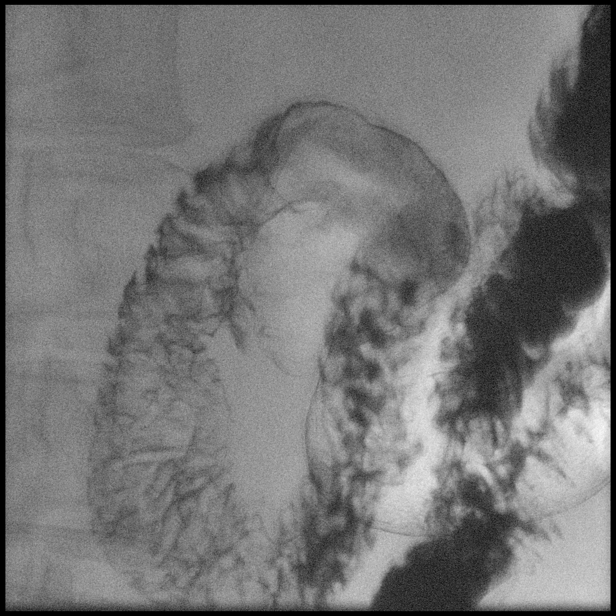
[frame 23/26]
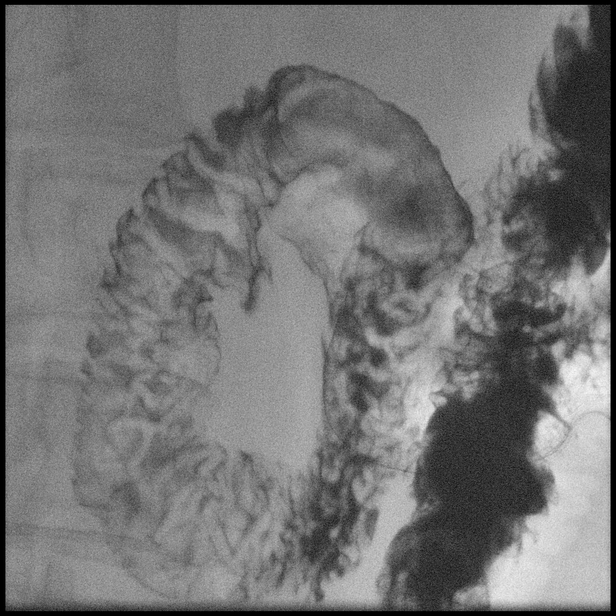

[Series 7: fluoro_barium 2fps_bw · 0.18mm/px · 1 of 1 slices shown (3 of 3)]
[im 1/1]
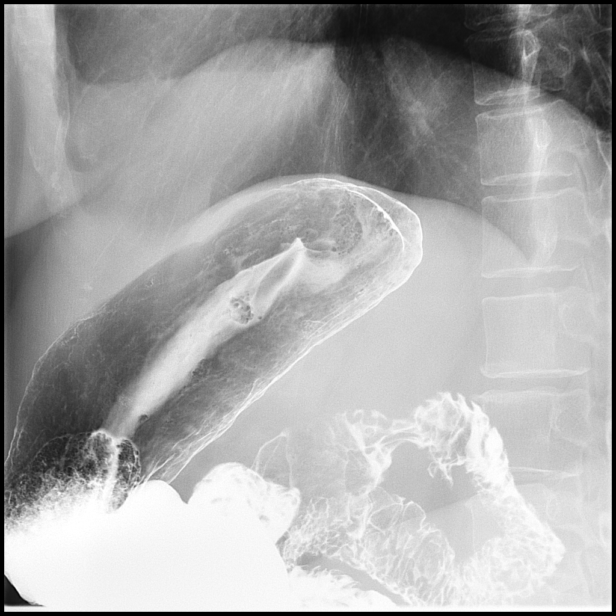

[Series 8: cp_standard · 0.18mm/px · 1 of 50 frames shown (4 of 8)]
[frame 37/50]
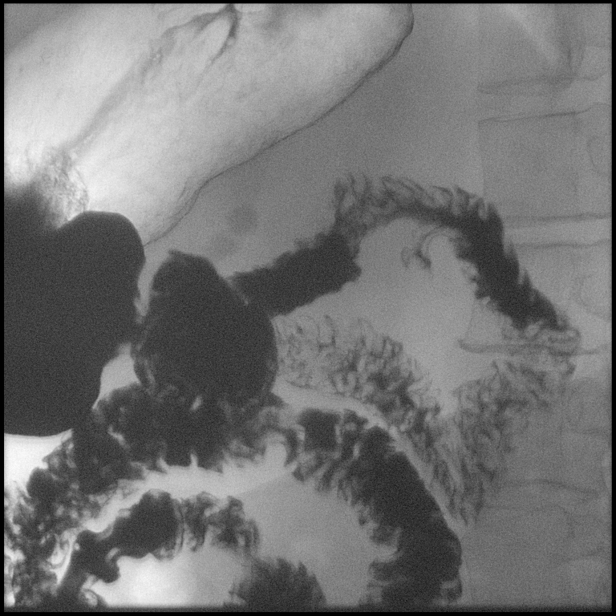

[Series 9: cp_standard · 0.18mm/px · 1 of 99 frames shown (5 of 8)]
[frame 50/99]
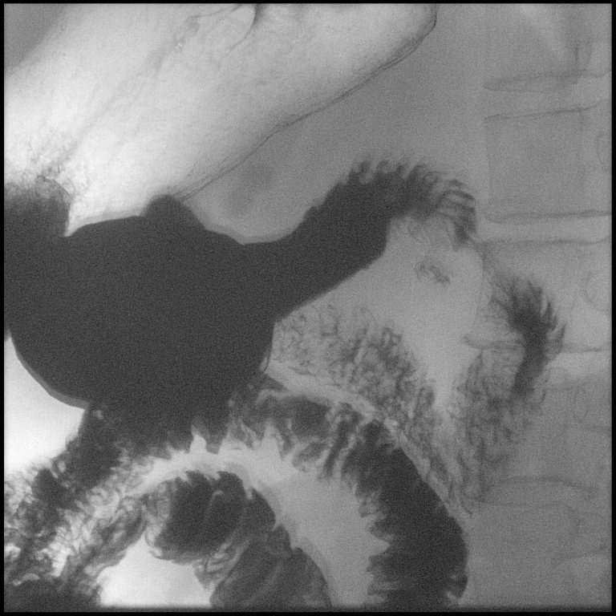

[Series 10: cp_standard · 0.18mm/px · 2 of 26 frames shown (6 of 8)]
[frame 1/26]
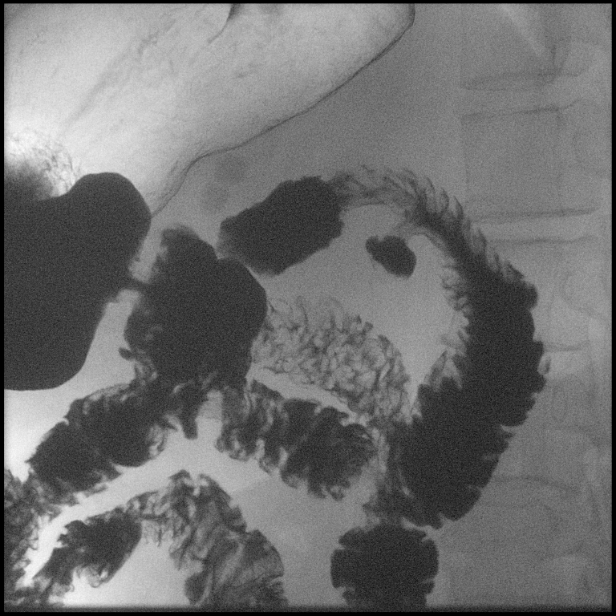
[frame 14/26]
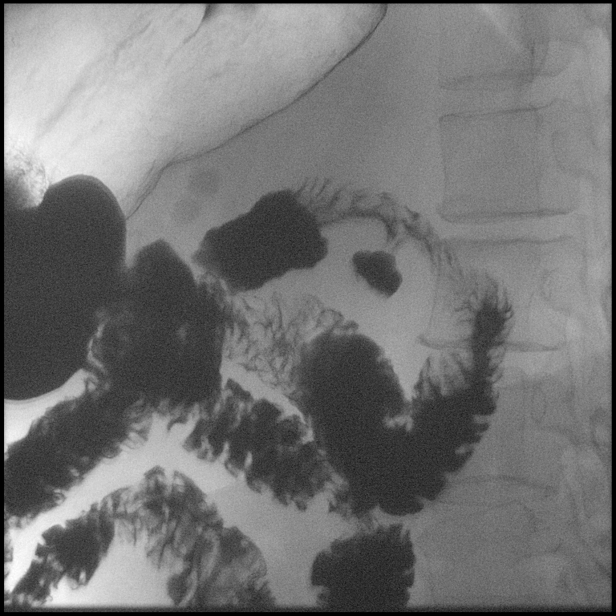

[Series 11: cp_standard · 0.18mm/px · 1 of 71 frames shown (7 of 8)]
[frame 36/71]
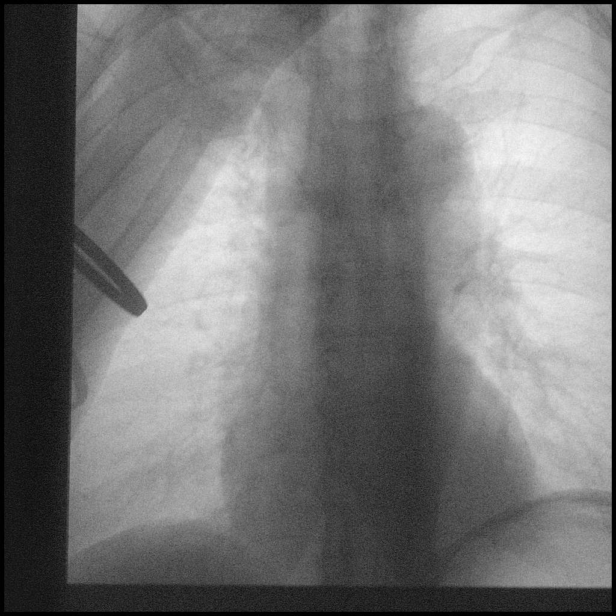

[Series 12: cp_standard · 0.18mm/px · 1 of 1 slices shown (8 of 8)]
[im 1/1]
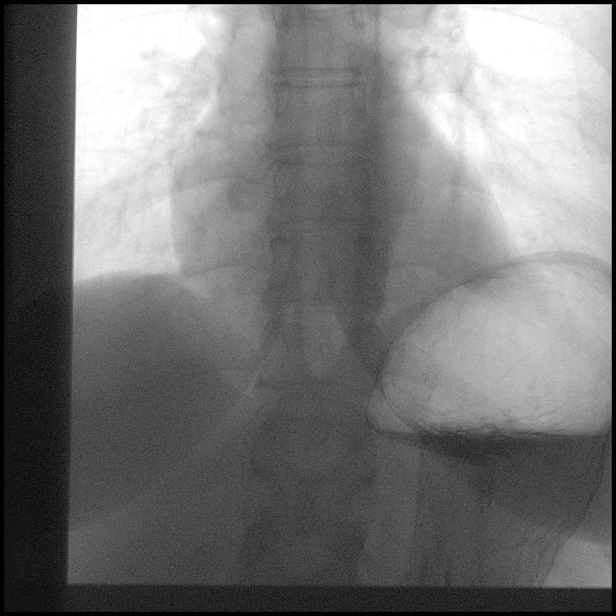

[14 of 24 positions shown; findings below may reference images not displayed]

FINDINGS: Swallowing mechanism and esophageal transit time are within normal
limits. The esophageal mucosa is unremarkable. No mucosal
abnormality is seen. No hiatal hernia is noted. Barium impregnated
tablet passes without difficulty.

Stomach distends well and demonstrates no intraluminal filling
defect. No mucosal abnormality is seen. The duodenal bulb and
duodenal sweep are within normal limits with the exception of a
small duodenal diverticulum in the second portion of the duodenum.
Gallstones are noted consistent with the patient's history.
IMPRESSION: Small duodenal diverticulum.

Otherwise unremarkable upper GI examination.

Cholelithiasis similar to that noted on prior ultrasound.

## 2020-11-30 ENCOUNTER — Encounter: Payer: Self-pay | Admitting: General Surgery
